# Patient Record
Sex: Male | Born: 1940
Health system: Southern US, Community
[De-identification: ages and names within clinical notes are randomized; demographics above are authoritative.]

## PROBLEM LIST (undated history)

## (undated) DIAGNOSIS — Z720 Tobacco use: Secondary | ICD-10-CM

## (undated) DIAGNOSIS — R011 Cardiac murmur, unspecified: Secondary | ICD-10-CM

## (undated) DIAGNOSIS — I739 Peripheral vascular disease, unspecified: Secondary | ICD-10-CM

## (undated) DIAGNOSIS — E785 Hyperlipidemia, unspecified: Secondary | ICD-10-CM

## (undated) DIAGNOSIS — I639 Cerebral infarction, unspecified: Secondary | ICD-10-CM

## (undated) DIAGNOSIS — I2510 Atherosclerotic heart disease of native coronary artery without angina pectoris: Secondary | ICD-10-CM

## (undated) DIAGNOSIS — H269 Unspecified cataract: Secondary | ICD-10-CM

## (undated) DIAGNOSIS — C61 Malignant neoplasm of prostate: Secondary | ICD-10-CM

## (undated) DIAGNOSIS — I10 Essential (primary) hypertension: Secondary | ICD-10-CM

## (undated) DIAGNOSIS — I493 Ventricular premature depolarization: Secondary | ICD-10-CM

## (undated) HISTORY — DX: Unspecified cataract: H26.9

## (undated) HISTORY — DX: Hyperlipidemia, unspecified: E78.5

## (undated) HISTORY — DX: Atherosclerotic heart disease of native coronary artery without angina pectoris: I25.10

## (undated) HISTORY — DX: Essential (primary) hypertension: I10

## (undated) HISTORY — DX: Malignant neoplasm of prostate: C61

## (undated) HISTORY — PX: EYE SURGERY: SHX253

---

## 2003-09-21 ENCOUNTER — Ambulatory Visit (HOSPITAL_COMMUNITY): Admission: RE | Admit: 2003-09-21 | Discharge: 2003-09-21 | Payer: Self-pay | Admitting: Hematology and Oncology

## 2004-03-05 ENCOUNTER — Ambulatory Visit: Payer: Self-pay | Admitting: *Deleted

## 2004-03-24 ENCOUNTER — Ambulatory Visit: Payer: Self-pay | Admitting: Family Medicine

## 2004-04-01 ENCOUNTER — Ambulatory Visit: Payer: Self-pay | Admitting: Family Medicine

## 2004-05-02 ENCOUNTER — Ambulatory Visit: Payer: Self-pay | Admitting: Family Medicine

## 2004-05-26 ENCOUNTER — Encounter (INDEPENDENT_AMBULATORY_CARE_PROVIDER_SITE_OTHER): Payer: Self-pay | Admitting: Family Medicine

## 2004-05-26 LAB — CONVERTED CEMR LAB: PSA: 2.7 ng/mL

## 2004-07-09 ENCOUNTER — Ambulatory Visit: Payer: Self-pay | Admitting: Family Medicine

## 2004-11-13 ENCOUNTER — Ambulatory Visit: Payer: Self-pay | Admitting: Family Medicine

## 2004-12-30 ENCOUNTER — Ambulatory Visit: Payer: Self-pay | Admitting: Family Medicine

## 2005-03-02 ENCOUNTER — Ambulatory Visit: Payer: Self-pay | Admitting: Family Medicine

## 2005-03-10 ENCOUNTER — Ambulatory Visit: Payer: Self-pay | Admitting: Family Medicine

## 2005-07-03 ENCOUNTER — Ambulatory Visit: Payer: Self-pay | Admitting: Family Medicine

## 2005-08-13 ENCOUNTER — Ambulatory Visit: Payer: Self-pay | Admitting: Family Medicine

## 2005-09-29 ENCOUNTER — Ambulatory Visit: Payer: Self-pay | Admitting: Family Medicine

## 2005-12-03 ENCOUNTER — Ambulatory Visit: Payer: Self-pay | Admitting: Family Medicine

## 2006-01-21 ENCOUNTER — Ambulatory Visit: Payer: Self-pay | Admitting: Family Medicine

## 2006-02-08 ENCOUNTER — Ambulatory Visit: Payer: Self-pay | Admitting: Family Medicine

## 2006-02-09 ENCOUNTER — Ambulatory Visit: Payer: Self-pay | Admitting: Family Medicine

## 2006-04-28 ENCOUNTER — Ambulatory Visit: Payer: Self-pay | Admitting: Nurse Practitioner

## 2006-06-22 ENCOUNTER — Ambulatory Visit: Payer: Self-pay | Admitting: Family Medicine

## 2006-10-26 ENCOUNTER — Ambulatory Visit: Payer: Self-pay | Admitting: Family Medicine

## 2006-11-03 ENCOUNTER — Encounter (INDEPENDENT_AMBULATORY_CARE_PROVIDER_SITE_OTHER): Payer: Self-pay | Admitting: Family Medicine

## 2006-11-03 DIAGNOSIS — F17200 Nicotine dependence, unspecified, uncomplicated: Secondary | ICD-10-CM

## 2006-11-03 DIAGNOSIS — I2510 Atherosclerotic heart disease of native coronary artery without angina pectoris: Secondary | ICD-10-CM

## 2006-11-03 DIAGNOSIS — I160 Hypertensive urgency: Secondary | ICD-10-CM

## 2006-11-25 ENCOUNTER — Ambulatory Visit: Payer: Self-pay | Admitting: Family Medicine

## 2006-12-30 ENCOUNTER — Ambulatory Visit: Payer: Self-pay | Admitting: Family Medicine

## 2007-01-07 ENCOUNTER — Encounter (INDEPENDENT_AMBULATORY_CARE_PROVIDER_SITE_OTHER): Payer: Self-pay | Admitting: Family Medicine

## 2007-01-07 ENCOUNTER — Ambulatory Visit: Payer: Self-pay | Admitting: *Deleted

## 2007-01-07 ENCOUNTER — Ambulatory Visit (HOSPITAL_COMMUNITY): Admission: RE | Admit: 2007-01-07 | Discharge: 2007-01-07 | Payer: Self-pay | Admitting: Family Medicine

## 2007-02-09 ENCOUNTER — Encounter (INDEPENDENT_AMBULATORY_CARE_PROVIDER_SITE_OTHER): Payer: Self-pay | Admitting: *Deleted

## 2007-04-06 ENCOUNTER — Encounter (INDEPENDENT_AMBULATORY_CARE_PROVIDER_SITE_OTHER): Payer: Self-pay | Admitting: Family Medicine

## 2007-04-06 ENCOUNTER — Ambulatory Visit: Payer: Self-pay | Admitting: Family Medicine

## 2007-04-06 LAB — CONVERTED CEMR LAB: Helicobacter Pylori Antibody-IgG: <0.4

## 2007-06-03 ENCOUNTER — Emergency Department (HOSPITAL_COMMUNITY): Admission: EM | Admit: 2007-06-03 | Discharge: 2007-06-03 | Payer: Self-pay | Admitting: Family Medicine

## 2007-06-23 ENCOUNTER — Ambulatory Visit: Payer: Self-pay | Admitting: Internal Medicine

## 2007-08-25 ENCOUNTER — Ambulatory Visit: Payer: Self-pay | Admitting: Family Medicine

## 2007-09-01 ENCOUNTER — Ambulatory Visit: Payer: Self-pay | Admitting: Oncology

## 2007-09-28 ENCOUNTER — Ambulatory Visit: Admission: RE | Admit: 2007-09-28 | Discharge: 2007-12-06 | Payer: Self-pay | Admitting: Radiation Oncology

## 2007-11-15 ENCOUNTER — Ambulatory Visit: Payer: Self-pay | Admitting: Family Medicine

## 2007-11-24 ENCOUNTER — Ambulatory Visit: Payer: Self-pay | Admitting: Family Medicine

## 2007-11-24 LAB — CONVERTED CEMR LAB
ALT: 14 units/L (ref 0–53)
Albumin: 4.7 g/dL (ref 3.5–5.2)
Calcium: 9.9 mg/dL (ref 8.4–10.5)
Cholesterol: 131 mg/dL (ref 0–200)
Creatinine, Ser: 0.98 mg/dL (ref 0.40–1.50)
LDL Cholesterol: 62 mg/dL (ref 0–99)
Sodium: 138 meq/L (ref 135–145)
Total CHOL/HDL Ratio: 2.7
Total Protein: 7.1 g/dL (ref 6.0–8.3)
Triglycerides: 103 mg/dL (ref <150)

## 2007-12-27 ENCOUNTER — Encounter (INDEPENDENT_AMBULATORY_CARE_PROVIDER_SITE_OTHER): Payer: Self-pay | Admitting: Family Medicine

## 2007-12-27 ENCOUNTER — Ambulatory Visit: Payer: Self-pay | Admitting: Internal Medicine

## 2007-12-27 LAB — CONVERTED CEMR LAB: Microalb, Ur: 0.47 mg/dL (ref 0.00–1.89)

## 2008-03-06 ENCOUNTER — Ambulatory Visit: Admission: RE | Admit: 2008-03-06 | Discharge: 2008-05-31 | Payer: Self-pay | Admitting: Radiation Oncology

## 2008-03-29 ENCOUNTER — Encounter: Admission: RE | Admit: 2008-03-29 | Discharge: 2008-03-29 | Payer: Self-pay | Admitting: Urology

## 2008-05-31 ENCOUNTER — Ambulatory Visit: Admission: RE | Admit: 2008-05-31 | Discharge: 2008-07-26 | Payer: Self-pay | Admitting: Radiation Oncology

## 2008-07-25 ENCOUNTER — Ambulatory Visit: Payer: Self-pay | Admitting: Internal Medicine

## 2008-08-23 ENCOUNTER — Ambulatory Visit (HOSPITAL_COMMUNITY): Admission: EM | Admit: 2008-08-23 | Discharge: 2008-08-23 | Payer: Self-pay | Admitting: Emergency Medicine

## 2008-08-23 ENCOUNTER — Encounter (INDEPENDENT_AMBULATORY_CARE_PROVIDER_SITE_OTHER): Payer: Self-pay | Admitting: Plastic Surgery

## 2008-11-15 ENCOUNTER — Ambulatory Visit: Payer: Self-pay | Admitting: Family Medicine

## 2009-01-05 ENCOUNTER — Emergency Department (HOSPITAL_BASED_OUTPATIENT_CLINIC_OR_DEPARTMENT_OTHER): Admission: EM | Admit: 2009-01-05 | Discharge: 2009-01-05 | Payer: Self-pay | Admitting: Emergency Medicine

## 2009-02-19 ENCOUNTER — Ambulatory Visit: Payer: Self-pay | Admitting: Family Medicine

## 2009-06-07 ENCOUNTER — Ambulatory Visit: Payer: Self-pay | Admitting: Family Medicine

## 2009-06-07 LAB — CONVERTED CEMR LAB
ALT: 10 units/L (ref 0–53)
Albumin: 4.6 g/dL (ref 3.5–5.2)
BUN: 14 mg/dL (ref 6–23)
CO2: 24 meq/L (ref 19–32)
Calcium: 9.3 mg/dL (ref 8.4–10.5)
Cholesterol: 153 mg/dL (ref 0–200)
Potassium: 4.2 meq/L (ref 3.5–5.3)
Total Bilirubin: 0.4 mg/dL (ref 0.3–1.2)
Total Protein: 6.8 g/dL (ref 6.0–8.3)
Triglycerides: 97 mg/dL (ref <150)
VLDL: 19 mg/dL (ref 0–40)

## 2009-08-02 ENCOUNTER — Ambulatory Visit: Payer: Self-pay | Admitting: Family Medicine

## 2009-10-30 ENCOUNTER — Ambulatory Visit: Payer: Self-pay | Admitting: Family Medicine

## 2010-01-30 ENCOUNTER — Ambulatory Visit: Payer: Self-pay | Admitting: Family Medicine

## 2010-01-30 LAB — CONVERTED CEMR LAB
ALT: 9 units/L (ref 0–53)
Albumin: 4.7 g/dL (ref 3.5–5.2)
BUN: 11 mg/dL (ref 6–23)
Basophils Absolute: 0.1 10*3/uL (ref 0.0–0.1)
Calcium: 9.8 mg/dL (ref 8.4–10.5)
Eosinophils Absolute: 0.3 10*3/uL (ref 0.0–0.7)
Eosinophils Relative: 4 % (ref 0–5)
HDL: 43 mg/dL (ref >39)
Hemoglobin: 16.3 g/dL (ref 13.0–17.0)
Hgb A1c MFr Bld: 8.6 % — ABNORMAL HIGH (ref <5.7)
LDL Cholesterol: 86 mg/dL (ref 0–99)
Monocytes Absolute: 0.6 10*3/uL (ref 0.1–1.0)
Monocytes Relative: 7 % (ref 3–12)
Neutro Abs: 4.8 10*3/uL (ref 1.7–7.7)
Neutrophils Relative %: 61 % (ref 43–77)
Platelets: 233 10*3/uL (ref 150–400)
Potassium: 4.3 meq/L (ref 3.5–5.3)
RDW: 13.1 % (ref 11.5–15.5)
Total CHOL/HDL Ratio: 3.5
Total Protein: 6.7 g/dL (ref 6.0–8.3)
VLDL: 20 mg/dL (ref 0–40)
WBC: 8 10*3/uL (ref 4.0–10.5)

## 2010-05-17 ENCOUNTER — Emergency Department (HOSPITAL_COMMUNITY)
Admission: EM | Admit: 2010-05-17 | Discharge: 2010-05-17 | Payer: Self-pay | Source: Home / Self Care | Admitting: Internal Medicine

## 2010-05-21 ENCOUNTER — Ambulatory Visit: Admission: RE | Admit: 2010-05-21 | Payer: Self-pay | Source: Home / Self Care | Admitting: Orthopedic Surgery

## 2010-06-05 ENCOUNTER — Encounter (INDEPENDENT_AMBULATORY_CARE_PROVIDER_SITE_OTHER): Payer: Self-pay | Admitting: Family Medicine

## 2010-06-05 LAB — CONVERTED CEMR LAB: Microalb, Ur: 1.18 mg/dL (ref 0.00–1.89)

## 2010-06-18 ENCOUNTER — Emergency Department (HOSPITAL_BASED_OUTPATIENT_CLINIC_OR_DEPARTMENT_OTHER)
Admission: EM | Admit: 2010-06-18 | Discharge: 2010-06-18 | Payer: Self-pay | Source: Home / Self Care | Admitting: Emergency Medicine

## 2010-06-26 LAB — BASIC METABOLIC PANEL: Glucose: 174 mg/dL

## 2010-06-26 LAB — CBC AND DIFFERENTIAL: Platelets: 293 10*3/uL (ref 150–399)

## 2010-09-03 LAB — CBC
HCT: 40.4 % (ref 39.0–52.0)
Hemoglobin: 13.9 g/dL (ref 13.0–17.0)
MCHC: 34.5 g/dL (ref 30.0–36.0)
MCV: 94.8 fL (ref 78.0–100.0)
Platelets: 313 10*3/uL (ref 150–400)
RBC: 4.26 MIL/uL (ref 4.22–5.81)
WBC: 8.5 10*3/uL (ref 4.0–10.5)

## 2010-09-03 LAB — DIFFERENTIAL
Basophils Absolute: 0 10*3/uL (ref 0.0–0.1)
Eosinophils Absolute: 0.3 10*3/uL (ref 0.0–0.7)
Lymphs Abs: 1.8 10*3/uL (ref 0.7–4.0)
Neutro Abs: 5.8 10*3/uL (ref 1.7–7.7)
Neutrophils Relative %: 68 % (ref 43–77)

## 2010-09-03 LAB — BASIC METABOLIC PANEL: Glucose, Bld: 158 mg/dL — ABNORMAL HIGH (ref 70–99)

## 2010-09-03 LAB — GLUCOSE, CAPILLARY: Glucose-Capillary: 172 mg/dL — ABNORMAL HIGH (ref 70–99)

## 2010-10-07 NOTE — Consult Note (Signed)
NAMEMELVERN, Joshua Ray              ACCOUNT NO.:  [id number]   MEDICAL RECORD NO.:  [id number]          PATIENT TYPE:  INP   LOCATION:  0098                         FACILITY:  [facility] [place]   PHYSICIAN:  Loreta Ave, MD DATE OF BIRTH:  July 06, 1940   DATE OF CONSULTATION:  08/23/2008  DATE OF DISCHARGE:                                 CONSULTATION   PREOPERATIVE DIAGNOSIS:  Mangled right index finger.   POSTOPERATIVE DIAGNOSIS:  Mangled right index finger.   PROCEDURE:  Ray amputation, right index finger.   SURGEON:  Dr. Noelle Penner.   ASSISTANT:  None.   ANESTHESIA:  Bier block.   TOURNIQUET TIME:  63 minutes at 325 mmHg.   CLINICAL INDICATIONS:  Joshua Ray is a 70 year old right hand  dominant male who sustained a multilevel near amputation injury to his  right index finger at the mid and distal phalanges.  I felt that  shortening his middle phalanx enough to provide soft tissue coverage  would subject him to loss of insertion of the FDS tendon.  Therefore, a  ray amputation was offered.  After a discussion of the risks of surgery  which include but are not limited to bleeding, infection, chronic pain,  stiffness, scarring, and the need for future surgery, Joshua Ray understands  these risks and desires to proceed.   DESCRIPTION OF THE OPERATION:  The patient was brought to the operating  room and placed in the supine position on the operating room table with  the right upper extremity on an arm board.  Anesthesia provided Bier  block anesthesia with a double tourniquet.  The right upper extremity  was prepped with chlorhexidine and draped into a sterile field.  A  fishmouth incision was made just distal the metacarpal head over the  right index finger and the extensor tendon apparatus was transected with  a 15 blade.  Superficial veins were cauterized with bipolar  electrocautery.  Next, the MCP joint was disarticulated and longitudinal  traction was placed on the finger after  transecting the volar plate.  This pulled the proximal flexor tendons into the wound and they were  transected with a 15 blade under tension.  Next, the digital arteries  were localized and coagulated with bipolar electrocautery on the volar  surface.  The digital nerves were isolated, traced distally, coagulated  with bipolar electrocautery and transected.  They were then  buried into  the soft tissue on the radial side of the long finger for the ulnar  digital nerve and deep in the web space for the radial-sided digital  nerve.  They were sewed over but not sewed themselves with 5-0 Monocryl  sutures.  Next, the soft tissue of the volar aspect of the hand was sewn  to the periosteum of the index finger metacarpal.  Next, proximal to the  MCP joint, the periosteum was incised with a 15 blade and stripped  proximally.  A 45 degree angle bevel was then created transversely in  the metacarpal with the ulnar portion being at the level of the  metacarpal head and the proximal portion being towards the thumb.  The  index finger metacarpal was then transected obliquely with a sagittal  saw.  Next, the wound was irrigated copiously with normal saline and  closed with 4-0 interrupted horizontal mattress sutures.  Xeroform and  sterile dressings were applied as was a volar  resting splint.  Sponge and needle counts were reported as correct times  two.  Before applying the splint, I injected with 10 cc of 0.5% Marcaine  plain into the area of the wound for postoperative pain control.  The  tourniquet was then deflated and a pink color returned to all remaining  digits.      Loreta Ave, MD  Electronically Signed     CF/MEDQ  D:  08/23/2008  T:  08/23/2008  Job:  [id number]

## 2011-07-11 ENCOUNTER — Ambulatory Visit (INDEPENDENT_AMBULATORY_CARE_PROVIDER_SITE_OTHER): Payer: Medicare Other | Admitting: Emergency Medicine

## 2011-07-11 ENCOUNTER — Ambulatory Visit: Payer: Medicare Other

## 2011-07-11 VITALS — BP 158/73 | HR 96 | Temp 97.7°F | Resp 18 | Ht 71.0 in | Wt 193.8 lb

## 2011-07-11 DIAGNOSIS — E119 Type 2 diabetes mellitus without complications: Secondary | ICD-10-CM

## 2011-07-11 DIAGNOSIS — R05 Cough: Secondary | ICD-10-CM

## 2011-07-11 DIAGNOSIS — J329 Chronic sinusitis, unspecified: Secondary | ICD-10-CM

## 2011-07-11 LAB — POCT CBC
Hemoglobin: 15.6 g/dL (ref 14.1–18.1)
Lymph, poc: 1.7 (ref 0.6–3.4)
MID (cbc): 0.6 (ref 0–0.9)
POC Granulocyte: 4.2 (ref 2–6.9)
POC LYMPH PERCENT: 26.4 %L (ref 10–50)
POC MID %: 8.6 %M (ref 0–12)
RBC: 5.08 M/uL (ref 4.69–6.13)

## 2011-07-11 LAB — GLUCOSE, POCT (MANUAL RESULT ENTRY): POC Glucose: 158

## 2011-07-11 MED ORDER — PREDNISONE 10 MG PO TABS: ORAL_TABLET | ORAL | Status: DC

## 2011-07-11 MED ORDER — AZITHROMYCIN 250 MG PO TABS: ORAL_TABLET | ORAL | Status: AC

## 2011-07-11 NOTE — Patient Instructions (Signed)
Cough, Adult  A cough is a reflex that helps clear your throat and airways. It can help heal the body or may be a reaction to an irritated airway. A cough may only last 2 or 3 weeks (acute) or may last more than 8 weeks (chronic).  CAUSES Acute cough:  Viral or bacterial infections.  Chronic cough:  Infections.   Allergies.   Asthma.   Post-nasal drip.   Smoking.   Heartburn or acid reflux.   Some medicines.   Chronic lung problems (COPD).   Cancer.  SYMPTOMS   Cough.   Fever.   Chest pain.   Increased breathing rate.   High-pitched whistling sound when breathing (wheezing).   Colored mucus that you cough up (sputum).  TREATMENT   A bacterial cough may be treated with antibiotic medicine.   A viral cough must run its course and will not respond to antibiotics.   Your caregiver may recommend other treatments if you have a chronic cough.  HOME CARE INSTRUCTIONS   Only take over-the-counter or prescription medicines for pain, discomfort, or fever as directed by your caregiver. Use cough suppressants only as directed by your caregiver.   Use a cold steam vaporizer or humidifier in your bedroom or home to help loosen secretions.   Sleep in a semi-upright position if your cough is worse at night.   Rest as needed.   Stop smoking if you smoke.  SEEK IMMEDIATE MEDICAL CARE IF:   You have pus in your sputum.   Your cough starts to worsen.   You cannot control your cough with suppressants and are losing sleep.   You begin coughing up blood.   You have difficulty breathing.   You develop pain which is getting worse or is uncontrolled with medicine.   You have a fever.  MAKE SURE YOU:   Understand these instructions.   Will watch your condition.   Will get help right away if you are not doing well or get worse.  Document Released: 11/07/2010 Document Revised: 01/21/2011 Document Reviewed: 11/07/2010 Exit[employer] Patient Information 2012 [employer].  Smoking Cessation This document explains the best ways for you to quit smoking and new treatments to help. It lists new medicines that can double or triple your chances of quitting and quitting for good. It also considers ways to avoid relapses and concerns you may have about quitting, including weight gain. NICOTINE: A POWERFUL ADDICTION If you have tried to quit smoking, you know how hard it can be. It is hard because nicotine is a very addictive drug. For some people, it can be as addictive as heroin or cocaine. Usually, people make 2 or 3 tries, or more, before finally being able to quit. Each time you try to quit, you can learn about what helps and what hurts. Quitting takes hard work and a lot of effort, but you can quit smoking. QUITTING SMOKING IS ONE OF THE MOST IMPORTANT THINGS YOU WILL EVER DO.  You will live longer, feel better, and live better.   The impact on your body of quitting smoking is felt almost immediately:   Within 20 minutes, blood pressure decreases. Pulse returns to its normal level.   After 8 hours, carbon monoxide levels in the blood return to normal. Oxygen level increases.   After 24 hours, chance of heart attack starts to decrease. Breath, hair, and body stop smelling like smoke.   After 48 hours, damaged nerve endings begin to recover. Sense of taste and   smell improve.   After 72 hours, the body is virtually free of nicotine. Bronchial tubes relax and breathing becomes easier.   After 2 to 12 weeks, lungs can hold more air. Exercise becomes easier and circulation improves.   Quitting will reduce your risk of having a heart attack, stroke, cancer, or lung disease:   After 1 year, the risk of coronary heart disease is cut in half.   After 5 years, the risk of stroke falls to the same as a nonsmoker.   After 10 years, the risk of lung cancer is cut in half and the risk of other cancers decreases significantly.   After 15 years, the risk of coronary  heart disease drops, usually to the level of a nonsmoker.   If you are pregnant, quitting smoking will improve your chances of having a healthy baby.   The people you live with, especially your children, will be healthier.   You will have extra money to spend on things other than cigarettes.  FIVE KEYS TO QUITTING Studies have shown that these 5 steps will help you quit smoking and quit for good. You have the best chances of quitting if you use them together: 1. Get ready.  2. Get support and encouragement.  3. Learn new skills and behaviors.  4. Get medicine to reduce your nicotine addiction and use it correctly.  5. Be prepared for relapse or difficult situations. Be determined to continue trying to quit, even if you do not succeed at first.  1. GET READY  Set a quit date.   Change your environment.   Get rid of ALL cigarettes, ashtrays, matches, and lighters in your home, car, and place of work.   Do not let people smoke in your home.   Review your past attempts to quit. Think about what worked and what did not.   Once you quit, do not smoke. NOT EVEN A PUFF!  2. GET SUPPORT AND ENCOURAGEMENT Studies have shown that you have a better chance of being successful if you have help. You can get support in many ways.  Tell your family, friends, and coworkers that you are going to quit and need their support. Ask them not to smoke around you.   Talk to your caregivers (doctor, dentist, nurse, pharmacist, psychologist, and/or smoking counselor).   Get individual, group, or telephone counseling and support. The more counseling you have, the better your chances are of quitting. Programs are available at local hospitals and health centers. Call your local health department for information about programs in your area.   Spiritual beliefs and practices may help some smokers quit.   Quit meters are small computer programs online or downloadable that keep track of quit statistics, such as  amount of "quit-time," cigarettes not smoked, and money saved.   Many smokers find one or more of the many self-help books available useful in helping them quit and stay off tobacco.  3. LEARN NEW SKILLS AND BEHAVIORS  Try to distract yourself from urges to smoke. Talk to someone, go for a walk, or occupy your time with a task.   When you first try to quit, change your routine. Take a different route to work. Drink tea instead of coffee. Eat breakfast in a different place.   Do something to reduce your stress. Take a hot bath, exercise, or read a book.   Plan something enjoyable to do every day. Reward yourself for not smoking.   Explore interactive web-based programs that specialize   in helping you quit.  4. GET MEDICINE AND USE IT CORRECTLY Medicines can help you stop smoking and decrease the urge to smoke. Combining medicine with the above behavioral methods and support can quadruple your chances of successfully quitting smoking. The U.S. Food and Drug Administration (FDA) has approved 7 medicines to help you quit smoking. These medicines fall into 3 categories.  Nicotine replacement therapy (delivers nicotine to your body without the negative effects and risks of smoking):   Nicotine gum: Available over-the-counter.   Nicotine lozenges: Available over-the-counter.   Nicotine inhaler: Available by prescription.   Nicotine nasal spray: Available by prescription.   Nicotine skin patches (transdermal): Available by prescription and over-the-counter.   Antidepressant medicine (helps people abstain from smoking, but how this works is unknown):   Bupropion sustained-release (SR) tablets: Available by prescription.   Nicotinic receptor partial agonist (simulates the effect of nicotine in your brain):   Varenicline tartrate tablets: Available by prescription.   Ask your caregiver for advice about which medicines to use and how to use them. Carefully read the information on the  package.   Everyone who is trying to quit may benefit from using a medicine. If you are pregnant or trying to become pregnant, nursing an infant, you are under age 18, or you smoke fewer than 10 cigarettes per day, talk to your caregiver before taking any nicotine replacement medicines.   You should stop using a nicotine replacement product and call your caregiver if you experience nausea, dizziness, weakness, vomiting, fast or irregular heartbeat, mouth problems with the lozenge or gum, or redness or swelling of the skin around the patch that does not go away.   Do not use any other product containing nicotine while using a nicotine replacement product.   Talk to your caregiver before using these products if you have diabetes, heart disease, asthma, stomach ulcers, you had a recent heart attack, you have high blood pressure that is not controlled with medicine, a history of irregular heartbeat, or you have been prescribed medicine to help you quit smoking.  5. BE PREPARED FOR RELAPSE OR DIFFICULT SITUATIONS  Most relapses occur within the first 3 months after quitting. Do not be discouraged if you start smoking again. Remember, most people try several times before they finally quit.   You may have symptoms of withdrawal because your body is used to nicotine. You may crave cigarettes, be irritable, feel very hungry, cough often, get headaches, or have difficulty concentrating.   The withdrawal symptoms are only temporary. They are strongest when you first quit, but they will go away within 10 to 14 days.  Here are some difficult situations to watch for:  Alcohol. Avoid drinking alcohol. Drinking lowers your chances of successfully quitting.   Caffeine. Try to reduce the amount of caffeine you consume. It also lowers your chances of successfully quitting.   Other smokers. Being around smoking can make you want to smoke. Avoid smokers.   Weight gain. Many smokers will gain weight when they  quit, usually less than 10 pounds. Eat a healthy diet and stay active. Do not let weight gain distract you from your main goal, quitting smoking. Some medicines that help you quit smoking may also help delay weight gain. You can always lose the weight gained after you quit.   Bad mood or depression. There are a lot of ways to improve your mood other than smoking.  If you are having problems with any of these situations, talk to your   caregiver. SPECIAL SITUATIONS AND CONDITIONS Studies suggest that everyone can quit smoking. Your situation or condition can give you a special reason to quit.  Pregnant women/new mothers: By quitting, you protect your baby's health and your own.   Hospitalized patients: By quitting, you reduce health problems and help healing.   Heart attack patients: By quitting, you reduce your risk of a second heart attack.   Lung, head, and neck cancer patients: By quitting, you reduce your chance of a second cancer.   Parents of children and adolescents: By quitting, you protect your children from illnesses caused by secondhand smoke.  QUESTIONS TO THINK ABOUT Think about the following questions before you try to stop smoking. You may want to talk about your answers with your caregiver.  Why do you want to quit?   If you tried to quit in the past, what helped and what did not?   What will be the most difficult situations for you after you quit? How will you plan to handle them?   Who can help you through the tough times? Your family? Friends? Caregiver?   What pleasures do you get from smoking? What ways can you still get pleasure if you quit?  Here are some questions to ask your caregiver:  How can you help me to be successful at quitting?   What medicine do you think would be best for me and how should I take it?   What should I do if I need more help?   What is smoking withdrawal like? How can I get information on withdrawal?  Quitting takes hard work and a  lot of effort, but you can quit smoking. FOR MORE INFORMATION  Smokefree.gov (http://www.smokefree.gov) provides free, accurate, evidence-based information and professional assistance to help support the immediate and long-term needs of people trying to quit smoking. Document Released: 05/05/2001 Document Revised: 01/21/2011 Document Reviewed: 02/25/2009 ExitCare Patient Information 2012 [employer]. 

## 2011-07-11 NOTE — Progress Notes (Signed)
  Subjective:    Patient ID: Joshua Ray, male    DOB: 20-Jan-1941, 71 y.o.   MRN: [id number]  HPI    Review of Systems     Objective:   Physical Exam        Assessment & Plan:

## 2011-07-11 NOTE — Progress Notes (Addendum)
  Subjective:    Patient ID: Joshua Ray, male    DOB: 1941/04/16, 71 y.o.   MRN: [id number]  HPI patient enters with onset Tuesday of head congestion. He also has developed a cough which is essentially nonproductive. He has a lot of facial pressure but denies any purulent nasal drainage. He has not coughed up any phlegm today. He continues to smoke a half pack a day. He is a diabetic but did not take his medication this morning. He has no interest in stopping smoking.    Review of Systems review of systems reveals the patient has coronary artery disease. He has had stents placed. He has not had any cardiac followup.     Objective:   Physical Exam HEENT exam reveals nasal congestion. The TMs are slightly injected. Throat is clear neck is supple the chest examination reveals wheezing in the right mid and lower lung fields. There is good air exchange. No rales were heard. Heart exam reveals a regular rate without murmurs rubs or gallop  [facility] reading (PRIMARY) by  Dr. Cleta Alberts chronic changes noted in both lower lung fields. No acute consolidated areas..       Assessment & Plan:  Initial assessment is upper respiratory infection with secondary sinusitis bronchitis. He has responded to antibiotics and steroids in the past. He did check a random sugar prior to give him prednisone.

## 2011-07-17 ENCOUNTER — Ambulatory Visit: Payer: MEDICARE | Admitting: Family Medicine

## 2011-07-19 ENCOUNTER — Ambulatory Visit (INDEPENDENT_AMBULATORY_CARE_PROVIDER_SITE_OTHER): Payer: Medicare Other | Admitting: Physician Assistant

## 2011-07-19 VITALS — BP 161/74 | HR 76 | Temp 97.5°F | Resp 16 | Ht 71.0 in | Wt 190.0 lb

## 2011-07-19 DIAGNOSIS — H6980 Other specified disorders of Eustachian tube, unspecified ear: Secondary | ICD-10-CM

## 2011-07-19 DIAGNOSIS — H6590 Unspecified nonsuppurative otitis media, unspecified ear: Secondary | ICD-10-CM

## 2011-07-19 DIAGNOSIS — H6690 Otitis media, unspecified, unspecified ear: Secondary | ICD-10-CM

## 2011-07-19 MED ORDER — CEFDINIR 300 MG PO CAPS: 600.0000 mg | ORAL_CAPSULE | Freq: Every day | ORAL | Status: AC

## 2011-07-19 MED ORDER — IPRATROPIUM BROMIDE 0.03 % NA SOLN: 2.0000 | Freq: Two times a day (BID) | NASAL | Status: DC

## 2011-07-19 NOTE — Patient Instructions (Addendum)
THE 3 SIMPLE RULES FOR NASAL SPRAY USE:  1. Don't snort. 2. Look at your toes and spray up your nose. 3. Use the opposite hand to spray in both nostrils.   Be sure to drink lots of water, at least 64 ounces daily, and get plenty of rest.  You can add Zyrtec (cetirizine) daily.  Complete the prednisone.  To reduce the risk of a nose bleed with the nasal spray, follow the instructions above and use a small amount of Vaseline on the tip of your finger to apply it to the inside of each nostril.

## 2011-07-19 NOTE — Progress Notes (Signed)
  Subjective:    Patient ID: Joshua Ray, male    DOB: 1941/03/25, 71 y.o.   MRN: [id number]  HPI This patient presents with bilateral ear fullness, worse on the right than the left. This occurred suddenly 5 days ago. He was seen one week ago with sinobronchitis. Has completed the Z-Pak and is tolerating the prednisone taper without difficulty.  He continues to smoke. Known coronary artery disease status post 4 stents. He also has diabetes. He is scheduled to followup with Dr. Audria Nine within the next 2 weeks to reestablish with her for primary care.  No fever or chills. No sore throat. Cough has not worsened. He has no intention of smoking cessation.   Review of Systems As above.    Objective:   Physical Exam Vital signs are noted. This is a well-developed, well-nourished white male who is awake, alert and oriented in no acute distress. Pupils are equal, round and reactive to light. Extraocular movements are intact. Sclera and conjunctiva are clear. Lateral external auditory canals are clear. There is serous fluid in the middle ear bilaterally. On the left there is purulence and injection in the attic. Nasal mucosa is erythematous with purulent drainage area oropharynx is clear. Neck is supple with mild tenderness on the right along the anterior cervical chain. No discrete lymphadenopathy is appreciated. No supraclavicular nodes. Heart has a regular rate and rhythm and lungs are clear bilaterally. Radial pulses are symmetrically strong. Skin is warm and dry.       Assessment & Plan:  Eustachian tube dysfunction. Bilateral serous otitis. Early left otitis media.  Cefdinir 300 mg, 2 daily x10 days. Atrovent nasal spray, 2 sprays in each nostril twice a day.  Anticipatory guidance provided.

## 2011-07-23 ENCOUNTER — Encounter: Payer: Self-pay | Admitting: *Deleted

## 2011-07-23 DIAGNOSIS — E785 Hyperlipidemia, unspecified: Secondary | ICD-10-CM

## 2011-07-23 DIAGNOSIS — I2510 Atherosclerotic heart disease of native coronary artery without angina pectoris: Secondary | ICD-10-CM

## 2011-07-23 DIAGNOSIS — I10 Essential (primary) hypertension: Secondary | ICD-10-CM

## 2011-07-24 ENCOUNTER — Ambulatory Visit (INDEPENDENT_AMBULATORY_CARE_PROVIDER_SITE_OTHER): Payer: Medicare Other | Admitting: Family Medicine

## 2011-07-24 VITALS — BP 145/79 | HR 96 | Temp 97.2°F | Resp 16 | Ht 71.0 in | Wt 190.0 lb

## 2011-07-24 DIAGNOSIS — F17200 Nicotine dependence, unspecified, uncomplicated: Secondary | ICD-10-CM

## 2011-07-24 DIAGNOSIS — I10 Essential (primary) hypertension: Secondary | ICD-10-CM

## 2011-07-24 DIAGNOSIS — I2510 Atherosclerotic heart disease of native coronary artery without angina pectoris: Secondary | ICD-10-CM

## 2011-07-24 DIAGNOSIS — E663 Overweight: Secondary | ICD-10-CM

## 2011-07-24 LAB — POCT GLYCOSYLATED HEMOGLOBIN (HGB A1C): Hemoglobin A1C: 7.9

## 2011-07-24 MED ORDER — METOPROLOL SUCCINATE ER 50 MG PO TB24: 50.0000 mg | ORAL_TABLET | Freq: Every day | ORAL | Status: DC

## 2011-07-24 MED ORDER — GLYBURIDE 5 MG PO TABS: 5.0000 mg | ORAL_TABLET | Freq: Two times a day (BID) | ORAL | Status: DC

## 2011-07-24 MED ORDER — SIMVASTATIN 20 MG PO TABS: 20.0000 mg | ORAL_TABLET | Freq: Every evening | ORAL | Status: DC

## 2011-07-24 MED ORDER — METFORMIN HCL 1000 MG PO TABS: 1000.0000 mg | ORAL_TABLET | Freq: Two times a day (BID) | ORAL | Status: DC

## 2011-07-24 NOTE — Patient Instructions (Signed)
Coronary Artery Disease, Risk Factors Research has shown that the risk of developing coronary artery disease (CAD) and having a heart attack increases with each factor you have. RISK FACTORS YOU CANNOT CHANGE Your age. Your risk goes up as you get older. Most heart attacks happen to people over the age of 50.  Gender. Men have a greater risk of heart attack than women, and they have attacks earlier in life. However, women are more likely to die from a heart attack.  Heredity. Children of parents with heart disease are more likely to develop it themselves.  Race. African Americans and other ethnic groups have a higher risk, possibly because of high blood pressure, a tendency toward obesity, and diabetes.  Your family. Most people with a strong family history of heart disease have one or more other risk factors.  RISK FACTORS YOU CAN CHANGE Exposure to tobacco smoke. Even secondhand smoke greatly increases the risk for heart disease.  High blood cholesterol may be lowered with changes in diet, activity, and medicines.  High blood pressure makes the heart work harder. This causes the heart muscles to become thick and, eventually, weaker. It also increases your risk of stroke, heart attack, and kidney or heart failure.  Physical inactivity is a risk factor for CAD. Regular physical activity helps prevent heart and blood vessel disease. Exercise helps control blood cholesterol, diabetes, obesity, and it may help lower blood pressure in some people.  Excess body fat, especially belly fat, increases the risk of heart disease and stroke even if there are no other risk factors. Excess weight increases the heart's workload and raises blood pressure and blood cholesterol.  Diabetes seriously increases your risk of developing CAD. If you have diabetes, you should work with your caregiver to manage it and control other risk factors.  OTHER RISK FACTORS FOR CAD How you respond to stress.  Drinking too much  alcohol may raise blood pressure, cause heart failure, and lead to stroke.  Total cholesterol greater than 200 milligrams.  HDL (good) cholesterol less than 40 milligrams. HDL helps keep cholesterol from building up in the walls of the arteries.  PREVENTING CAD Maintain a healthy weight.  Exercise or do physical activity.  Eat a heart-healthy diet low in fat and salt and high in fiber.  Control your blood pressure to keep it below 120 over 80.  Keep your cholesterol at a level that lowers your risk.  Manage diabetes if you have it.  Stop smoking.  Learn how to manage stress.  HEART SMART SUBSTITUTIONS Instead of whole or 2% milk and cream, use skim milk.  Instead of fried foods, eat baked, steamed, boiled, broiled, or microwaved foods.  Instead of lard, butter, palm and coconut oils, cook with unsaturated vegetable oils, such as corn, olive, canola, safflower, sesame, soybean, sunflower, or peanut.  Instead of fatty cuts of meat, eat lean cuts of meat or cut off the fatty parts.  Instead of 1 whole egg in recipes, use 2 egg whites.  Instead of sauces, butter, and salt, season vegetables with herbs and spices.  Instead of regular hard and processed cheeses, eat low-fat, low-sodium cheeses.  Instead of salted potato chips, choose low-fat, unsalted tortilla and potato chips and unsalted pretzels and popcorn.  Instead of sour cream and mayonnaise, use plain low-fat yogurt, low-fat cottage cheese, or low-fat or "light" sour cream.  FOR MORE INFORMATION  National Heart Lung and Blood Institute: https://nielsen.com/ American Heart Association: PopSteam.is Document Released: 08/01/2003 Document Revised: 01/21/2011  Document Reviewed: 07/27/2007 [facility] Patient Information 2012 [place], [place]    .Smoking Cessation This document explains the best ways for you to quit smoking and new treatments to help. It lists new medicines that can double or triple your chances of  quitting and quitting for good. It also considers ways to avoid relapses and concerns you may have about quitting, including weight gain. NICOTINE: A POWERFUL ADDICTION If you have tried to quit smoking, you know how hard it can be. It is hard because nicotine is a very addictive drug. For some people, it can be as addictive as heroin or cocaine. Usually, people make 2 or 3 tries, or more, before finally being able to quit. Each time you try to quit, you can learn about what helps and what hurts. Quitting takes hard work and a lot of effort, but you can quit smoking. QUITTING SMOKING IS ONE OF THE MOST IMPORTANT THINGS YOU WILL EVER DO.  You will live longer, feel better, and live better.   The impact on your body of quitting smoking is felt almost immediately:   Within 20 minutes, blood pressure decreases. Pulse returns to its normal level.   After 8 hours, carbon monoxide levels in the blood return to normal. Oxygen level increases.   After 24 hours, chance of heart attack starts to decrease. Breath, hair, and body stop smelling like smoke.   After 48 hours, damaged nerve endings begin to recover. Sense of taste and smell improve.   After 72 hours, the body is virtually free of nicotine. Bronchial tubes relax and breathing becomes easier.   After 2 to 12 weeks, lungs can hold more air. Exercise becomes easier and circulation improves.   Quitting will reduce your risk of having a heart attack, stroke, cancer, or lung disease:   After 1 year, the risk of coronary heart disease is cut in half.   After 5 years, the risk of stroke falls to the same as a nonsmoker.   After 10 years, the risk of lung cancer is cut in half and the risk of other cancers decreases significantly.   After 15 years, the risk of coronary heart disease drops, usually to the level of a nonsmoker.   If you are pregnant, quitting smoking will improve your chances of having a healthy baby.   The people you live with,  especially your children, will be healthier.   You will have extra money to spend on things other than cigarettes.  FIVE KEYS TO QUITTING Studies have shown that these 5 steps will help you quit smoking and quit for good. You have the best chances of quitting if you use them together: 1. Get ready.  2. Get support and encouragement.  3. Learn new skills and behaviors.  4. Get medicine to reduce your nicotine addiction and use it correctly.  5. Be prepared for relapse or difficult situations. Be determined to continue trying to quit, even if you do not succeed at first.  1. GET READY  Set a quit date.   Change your environment.   Get rid of ALL cigarettes, ashtrays, matches, and lighters in your home, car, and place of work.   Do not let people smoke in your home.   Review your past attempts to quit. Think about what worked and what did not.   Once you quit, do not smoke. NOT EVEN A PUFF!  2. GET SUPPORT AND ENCOURAGEMENT Studies have shown that you have a better chance of being successful  if you have help. You can get support in many ways.  Tell your family, friends, and coworkers that you are going to quit and need their support. Ask them not to smoke around you.   Talk to your caregivers (doctor, dentist, nurse, pharmacist, psychologist, and/or smoking counselor).   Get individual, group, or telephone counseling and support. The more counseling you have, the better your chances are of quitting. Programs are available at [employer] and health centers. Call your local health department for information about programs in your area.   Spiritual beliefs and practices may help some smokers quit.   Quit meters are Photographer that keep track of quit statistics, such as amount of "quit-time," cigarettes not smoked, and money saved.   Many smokers find one or more of the many self-help books available useful in helping them quit and stay off  tobacco.  3. LEARN NEW SKILLS AND BEHAVIORS  Try to distract yourself from urges to smoke. Talk to someone, go for a walk, or occupy your time with a task.   When you first try to quit, change your routine. Take a different route to work. Drink tea instead of coffee. Eat breakfast in a different place.   Do something to reduce your stress. Take a hot bath, exercise, or read a book.   Plan something enjoyable to do every day. Reward yourself for not smoking.   Explore interactive web-based programs that specialize in helping you quit.  4. GET MEDICINE AND USE IT CORRECTLY Medicines can help you stop smoking and decrease the urge to smoke. Combining medicine with the above behavioral methods and support can quadruple your chances of successfully quitting smoking. The [place]. Food and Drug Administration (FDA) has approved 7 medicines to help you quit smoking. These medicines fall into 3 categories.  Nicotine replacement therapy (delivers nicotine to your body without the negative effects and risks of smoking):   Nicotine gum: Available over-the-counter.   Nicotine lozenges: Available over-the-counter.   Nicotine inhaler: Available by prescription.   Nicotine nasal spray: Available by prescription.   Nicotine skin patches (transdermal): Available by prescription and over-the-counter.   Antidepressant medicine (helps people abstain from smoking, but how this works is unknown):   Bupropion sustained-release (SR) tablets: Available by prescription.   Nicotinic receptor partial agonist (simulates the effect of nicotine in your brain):   Varenicline tartrate tablets: Available by prescription.   Ask your caregiver for advice about which medicines to use and how to use them. Carefully read the information on the package.   Everyone who is trying to quit may benefit from using a medicine. If you are pregnant or trying to become pregnant, nursing an infant, you are under age 81, or you smoke  fewer than 10 cigarettes per day, talk to your caregiver before taking any nicotine replacement medicines.   You should stop using a nicotine replacement product and call your caregiver if you experience nausea, dizziness, weakness, vomiting, fast or irregular heartbeat, mouth problems with the lozenge or gum, or redness or swelling of the skin around the patch that does not go away.   Do not use any other product containing nicotine while using a nicotine replacement product.   Talk to your caregiver before using these products if you have diabetes, heart disease, asthma, stomach ulcers, you had a recent heart attack, you have high blood pressure that is not controlled with medicine, a history of irregular heartbeat, or you have been prescribed  medicine to help you quit smoking.  5. BE PREPARED FOR RELAPSE OR DIFFICULT SITUATIONS  Most relapses occur within the first 3 months after quitting. Do not be discouraged if you start smoking again. Remember, most people try several times before they finally quit.   You may have symptoms of withdrawal because your body is used to nicotine. You may crave cigarettes, be irritable, feel very hungry, cough often, get headaches, or have difficulty concentrating.   The withdrawal symptoms are only temporary. They are strongest when you first quit, but they will go away within 10 to 14 days.  Here are some difficult situations to watch for:  Alcohol. Avoid drinking alcohol. Drinking lowers your chances of successfully quitting.   Caffeine. Try to reduce the amount of caffeine you consume. It also lowers your chances of successfully quitting.   Other smokers. Being around smoking can make you want to smoke. Avoid smokers.   Weight gain. Many smokers will gain weight when they quit, usually less than 10 pounds. Eat a healthy diet and stay active. Do not let weight gain distract you from your main goal, quitting smoking. Some medicines that help you quit smoking  may also help delay weight gain. You can always lose the weight gained after you quit.   Bad mood or depression. There are a lot of ways to improve your mood other than smoking.  If you are having problems with any of these situations, talk to your caregiver. SPECIAL SITUATIONS AND CONDITIONS Studies suggest that everyone can quit smoking. Your situation or condition can give you a special reason to quit.  Pregnant women/new mothers: By quitting, you protect your baby's health and your own.   Hospitalized patients: By quitting, you reduce health problems and help healing.   Heart attack patients: By quitting, you reduce your risk of a second heart attack.   Lung, head, and neck cancer patients: By quitting, you reduce your chance of a second cancer.   Parents of children and adolescents: By quitting, you protect your children from illnesses caused by secondhand smoke.  QUESTIONS TO THINK ABOUT Think about the following questions before you try to stop smoking. You may want to talk about your answers with your caregiver.  Why do you want to quit?   If you tried to quit in the past, what helped and what did not?   What will be the most difficult situations for you after you quit? How will you plan to handle them?   Who can help you through the tough times? Your family? Friends? Caregiver?   What pleasures do you get from smoking? What ways can you still get pleasure if you quit?  Here are some questions to ask your caregiver:  How can you help me to be successful at quitting?   What medicine do you think would be best for me and how should I take it?   What should I do if I need more help?   What is smoking withdrawal like? How can I get information on withdrawal?  Quitting takes hard work and a lot of effort, but you can quit smoking. FOR MORE INFORMATION  Smokefree.gov (http://www.davis-sullivan.com/) provides free, accurate, evidence-based information and professional assistance to  help support the immediate and long-term needs of people trying to quit smoking. Document Released: 05/05/2001 Document Revised: 01/21/2011 Document Reviewed: 02/25/2009 [facility] Patient Information 2012 [place], [place].

## 2011-07-24 NOTE — Progress Notes (Signed)
  Subjective:    Patient ID: Joshua Ray, male    DOB: May 03, 1941, 71 y.o.   MRN: [id number]  HPI  This 71 y.o Cauc male has HTN/ CAD, Type II Diabetes Mellitus (fair control). Recently treated for  Sinusitis/bronchitis with anitibiotic and steroids which elevated FSBS up to ~200. Yesterday, fasting  BS was 105. Recent evaluation by Podiatrist- no neuropathic problems with feet. He is compliant with   all medications Pt continues to smoke.   Review of Systems Noncontributory    Objective:   Physical Exam  Nursing note and vitals reviewed. Constitutional: He is oriented to person, place, and time. He appears well-developed and well-nourished. No distress.  HENT:  Head: Normocephalic and atraumatic.  Eyes: Conjunctivae and EOM are normal. No scleral icterus.  Cardiovascular: Normal rate.   Pulmonary/Chest: Effort normal. No respiratory distress.  Musculoskeletal: Normal range of motion.  Neurological: He is alert and oriented to person, place, and time. No cranial nerve deficit.  Skin: Skin is warm and dry.    Results for orders placed in visit on 07/24/11  POCT GLYCOSYLATED HEMOGLOBIN (HGB A1C)      Component Value Range   Hemoglobin A1C 7.9           Assessment & Plan:   1. Type II or unspecified type diabetes mellitus without mention of complication, uncontrolled  POCT HgB A1C Continue current medications and address nutrition issues; reminded pt of A1c goal < 7.0%  2. HTN, goal below 130/80    3. CAD (coronary artery disease)    4. Overweight (BMI 25.0-29.9)  Encouraged weight reduction   5.  Tobacco user                                                     Advised of ongoing risk and known CAD with continued tobacco use

## 2011-07-28 ENCOUNTER — Encounter: Payer: Self-pay | Admitting: Family Medicine

## 2011-08-18 ENCOUNTER — Ambulatory Visit: Payer: Self-pay | Admitting: Internal Medicine

## 2011-08-18 ENCOUNTER — Ambulatory Visit (INDEPENDENT_AMBULATORY_CARE_PROVIDER_SITE_OTHER): Payer: Medicare Other | Admitting: Internal Medicine

## 2011-08-18 VITALS — BP 136/78 | HR 93 | Temp 97.9°F | Resp 18 | Ht 71.5 in | Wt 186.0 lb

## 2011-08-18 DIAGNOSIS — F17200 Nicotine dependence, unspecified, uncomplicated: Secondary | ICD-10-CM

## 2011-08-18 DIAGNOSIS — J301 Allergic rhinitis due to pollen: Secondary | ICD-10-CM

## 2011-08-18 DIAGNOSIS — H6690 Otitis media, unspecified, unspecified ear: Secondary | ICD-10-CM

## 2011-08-18 DIAGNOSIS — J019 Acute sinusitis, unspecified: Secondary | ICD-10-CM

## 2011-08-18 DIAGNOSIS — J209 Acute bronchitis, unspecified: Secondary | ICD-10-CM

## 2011-08-18 MED ORDER — METHYLPREDNISOLONE ACETATE 80 MG/ML IJ SUSP
80.0000 mg | Freq: Once | INTRAMUSCULAR | Status: AC
  Administered 2011-08-18: 120 mg via INTRAMUSCULAR

## 2011-08-18 MED ORDER — HYDROCODONE-ACETAMINOPHEN 7.5-500 MG/15ML PO SOLN: 5.0000 mL | Freq: Four times a day (QID) | ORAL | Status: AC | PRN

## 2011-08-18 MED ORDER — CEFTRIAXONE SODIUM 1 G IJ SOLR
1.0000 g | INTRAMUSCULAR | Status: DC
  Administered 2011-08-18: 1 g via INTRAMUSCULAR

## 2011-08-18 NOTE — Patient Instructions (Signed)
Smoking Cessation, Tips for Success YOU CAN QUIT SMOKING If you are ready to quit smoking, congratulations! You have chosen to help yourself be healthier. Cigarettes bring nicotine, tar, carbon monoxide, and other irritants into your body. Your lungs, heart, and blood vessels will be able to work better without these poisons. There are many different ways to quit smoking. Nicotine gum, nicotine patches, a nicotine inhaler, or nicotine nasal spray can help with physical craving. Hypnosis, support groups, and medicines help break the habit of smoking. Here are some tips to help you quit for good.  Throw away all cigarettes.   Clean and remove all ashtrays from your home, work, and car.   On a card, write down your reasons for quitting. Carry the card with you and read it when you get the urge to smoke.   Cleanse your body of nicotine. Drink enough water and fluids to keep your urine clear or pale yellow. Do this after quitting to flush the nicotine from your body.   Learn to predict your moods. Do not let a bad situation be your excuse to have a cigarette. Some situations in your life might tempt you into wanting a cigarette.   Never have "just one" cigarette. It leads to wanting another and another. Remind yourself of your decision to quit.   Change habits associated with smoking. If you smoked while driving or when feeling stressed, try other activities to replace smoking. Stand up when drinking your coffee. Brush your teeth after eating. Sit in a different chair when you read the paper. Avoid alcohol while trying to quit, and try to drink fewer caffeinated beverages. Alcohol and caffeine may urge you to smoke.   Avoid foods and drinks that can trigger a desire to smoke, such as sugary or spicy foods and alcohol.   Ask people who smoke not to smoke around you.   Have something planned to do right after eating or having a cup of coffee. Take a walk or exercise to perk you up. This will help to  keep you from overeating.   Try a relaxation exercise to calm you down and decrease your stress. Remember, you may be tense and nervous for the first 2 weeks after you quit, but this will pass.   Find new activities to keep your hands busy. Play with a pen, coin, or rubber band. Doodle or draw things on paper.   Brush your teeth right after eating. This will help cut down on the craving for the taste of tobacco after meals. You can try mouthwash, too.   Use oral substitutes, such as lemon drops, carrots, a cinnamon stick, or chewing gum, in place of cigarettes. Keep them handy so they are available when you have the urge to smoke.   When you have the urge to smoke, try deep breathing.   Designate your home as a nonsmoking area.   If you are a heavy smoker, ask your caregiver about a prescription for nicotine chewing gum. It can ease your withdrawal from nicotine.   Reward yourself. Set aside the cigarette money you save and buy yourself something nice.   Look for support from others. Join a support group or smoking cessation program. Ask someone at home or at work to help you with your plan to quit smoking.   Always ask yourself, "Do I need this cigarette or is this just a reflex?" Tell yourself, "Today, I choose not to smoke," or "I do not want to smoke." You are  reminding yourself of your decision to quit, even if you do smoke a cigarette.  HOW WILL I FEEL WHEN I QUIT SMOKING?  The benefits of not smoking start within days of quitting.   You may have symptoms of withdrawal because your body is used to nicotine (the addictive substance in cigarettes). You may crave cigarettes, be irritable, feel very hungry, cough often, get headaches, or have difficulty concentrating.   The withdrawal symptoms are only temporary. They are strongest when you first quit but will go away within 10 to 14 days.   When withdrawal symptoms occur, stay in control. Think about your reasons for quitting. Remind  yourself that these are signs that your body is healing and getting used to being without cigarettes.   Remember that withdrawal symptoms are easier to treat than the major diseases that smoking can cause.   Even after the withdrawal is over, expect periodic urges to smoke. However, these cravings are generally short-lived and will go away whether you smoke or not. Do not smoke!   If you relapse and smoke again, do not lose hope. Most smokers quit 3 times before they are successful.   If you relapse, do not give up! Plan ahead and think about what you will do the next time you get the urge to smoke.  LIFE AS A NONSMOKER: MAKE IT FOR A MONTH, MAKE IT FOR LIFE Day 1: Hang this page where you will see it every day. Day 2: Get rid of all ashtrays, matches, and lighters. Day 3: Drink water. Breathe deeply between sips. Day 4: Avoid places with smoke-filled air, such as bars, clubs, or the smoking section of restaurants. Day 5: Keep track of how much money you save by not smoking. Day 6: Avoid boredom. Keep a good book with you or go to the movies. Day 7: Reward yourself! One week without smoking! Day 8: Make a dental appointment to get your teeth cleaned. Day 9: Decide how you will turn down a cigarette before it is offered to you. Day 10: Review your reasons for quitting. Day 11: Distract yourself. Stay active to keep your mind off smoking and to relieve tension. Take a walk, exercise, read a book, do a crossword puzzle, or try a new hobby. Day 12: Exercise. Get off the bus before your stop or use stairs instead of escalators. Day 13: Call on friends for support and encouragement. Day 14: Reward yourself! Two weeks without smoking! Day 15: Practice deep breathing exercises. Day 16: Bet a friend that you can stay a nonsmoker. Day 17: Ask to sit in nonsmoking sections of restaurants. Day 18: Hang up "No Smoking" signs. Day 19: Think of yourself as a nonsmoker. Day 20: Each morning, tell  yourself you will not smoke. Day 21: Reward yourself! Three weeks without smoking! Day 22: Think of smoking in negative ways. Remember how it stains your teeth, gives you bad breath, and leaves you short of breath. Day 23: Eat a nutritious breakfast. Day 24:Do not relive your days as a smoker. Day 25: Hold a pencil in your hand when talking on the telephone. Day 26: Tell all your friends you do not smoke. Day 27: Think about how much better food tastes. Day 28: Remember, one cigarette is one too many. Day 29: Take up a hobby that will keep your hands busy. Day 30: Congratulations! One month without smoking! Give yourself a big reward. Your caregiver can direct you to community resources or hospitals for support, which  may include:  Group support.   Education.   Hypnosis.   Subliminal therapy.  Document Released: 02/07/2004 Document Revised: 04/30/2011 Document Reviewed: 02/25/2009 [facility] Patient Information 2012 [place], [place].Sinusitis Sinuses are air pockets within the bones of your face. The growth of bacteria within a sinus leads to infection. The infection prevents the sinuses from draining. This infection is called sinusitis. SYMPTOMS  There will be different areas of pain depending on which sinuses have become infected.  The maxillary sinuses often produce pain beneath the eyes.   Frontal sinusitis may cause pain in the middle of the forehead and above the eyes.  Other problems (symptoms) include:  Toothaches.   Colored, pus-like (purulent) drainage from the nose.   Swelling, warmth, and tenderness over the sinus areas may be signs of infection.  TREATMENT  Sinusitis is most often determined by an exam.X-rays may be taken. If x-rays have been taken, make sure you obtain your results or find out how you are to obtain them. Your caregiver may give you medications (antibiotics). These are medications that will help kill the bacteria causing the infection. You may also be  given a medication (decongestant) that helps to reduce sinus swelling.  HOME CARE INSTRUCTIONS   Only take over-the-counter or prescription medicines for pain, discomfort, or fever as directed by your caregiver.   Drink extra fluids. Fluids help thin the mucus so your sinuses can drain more easily.   Applying either moist heat or ice packs to the sinus areas may help relieve discomfort.   Use saline nasal sprays to help moisten your sinuses. The sprays can be found at your local drugstore.  SEEK IMMEDIATE MEDICAL CARE IF:  You have a fever.   You have increasing pain, severe headaches, or toothache.   You have nausea, vomiting, or drowsiness.   You develop unusual swelling around the face or trouble seeing.  MAKE SURE YOU:   Understand these instructions.   Will watch your condition.   Will get help right away if you are not doing well or get worse.  Document Released: 05/11/2005 Document Revised: 04/30/2011 Document Reviewed: 12/08/2006 [facility] Patient Information 2012 [place], [place].

## 2011-08-18 NOTE — Progress Notes (Signed)
  Subjective:    Patient ID: Joshua Ray, male    DOB: 1941/02/27, 71 y.o.   MRN: [id number]  HPI Has persistent head congestion and facial pressure. Has cough with sputum. Going on for months and wants to see allergist--Dr. Johney Maine number given Heavy long term smoker and reluctant to quit. No sob, chest pain    Review of Systems Dr. Audria Nine primary    Objective:   Physical Exam Sinus tenderness and discharge R TM red and tender Lungs rhonchi and wheezes and sputum Smells of smoke       Assessment & Plan:  Refer to allergy Quit smoking Rocephin 1g IM Depomedrol 120mg  IM Amoxil 1g bid x21d Lortab elixir 60z 1 rf

## 2011-10-06 ENCOUNTER — Encounter: Payer: Self-pay | Admitting: Family Medicine

## 2011-10-06 ENCOUNTER — Ambulatory Visit (INDEPENDENT_AMBULATORY_CARE_PROVIDER_SITE_OTHER): Payer: Medicare Other | Admitting: Family Medicine

## 2011-10-06 VITALS — BP 141/78 | HR 94 | Temp 98.8°F | Resp 18 | Ht 70.0 in | Wt 186.4 lb

## 2011-10-06 DIAGNOSIS — T7840XA Allergy, unspecified, initial encounter: Secondary | ICD-10-CM

## 2011-10-06 LAB — POCT CBC
Granulocyte percent: 61.6 %G (ref 37–80)
HCT, POC: 42.4 % — AB (ref 43.5–53.7)
MID (cbc): 0.7 (ref 0–0.9)
POC Granulocyte: 5.4 (ref 2–6.9)
POC LYMPH PERCENT: 30.4 %L (ref 10–50)
Platelet Count, POC: 300 10*3/uL (ref 142–424)
RBC: 4.52 M/uL — AB (ref 4.69–6.13)
RDW, POC: 14.4 %

## 2011-10-06 NOTE — Progress Notes (Signed)
  Subjective:    Patient ID: Joshua Ray, male    DOB: 08/04/1940, 71 y.o.   MRN: [id number]  HPI   Pt presents with hx of left tongue swelling 2 days ago; he immediately started taking Benadryl  and had resolution after 8 doses. He had a similar episode ~ 1 1/2 years ago. He thinks it may be   Simvastatin which he has taken for years. He has allergies and "sinus" problems but takes nothing  routinely.He denies any insect bite or allergy to any food. He has not taken SImvastatin since Saturday  night (3 days ago).   Review of Systems As per HPI;otherwise, unremakable    Objective:   Physical Exam  Nursing note and vitals reviewed. Constitutional: He is oriented to person, place, and time. He appears well-developed and well-nourished. No distress.  HENT:  Head: Normocephalic and atraumatic.  Right Ear: External ear normal.  Left Ear: External ear normal.  Nose: Nose normal.  Mouth/Throat: Oropharynx is clear and moist. No oropharyngeal exudate.       Tongue appears normal  Eyes: Conjunctivae and EOM are normal. No scleral icterus.  Neck: Neck supple.  Cardiovascular: Normal rate.   Pulmonary/Chest: Effort normal. No respiratory distress.  Lymphadenopathy:    He has no cervical adenopathy.  Neurological: He is alert and oriented to person, place, and time.  Skin: Skin is warm and dry. No erythema.    Results for orders placed in visit on 10/06/11  POCT CBC      Component Value Range   WBC 8.7  4.6 - 10.2 (K/uL)   Lymph, poc 2.6  0.6 - 3.4    POC LYMPH PERCENT 30.4  10 - 50 (%L)   MID (cbc) 0.7  0 - 0.9    POC MID % 8.0  0 - 12 (%M)   POC Granulocyte 5.4  2 - 6.9    Granulocyte percent 61.6  37 - 80 (%G)   RBC 4.52 (*) 4.69 - 6.13 (M/uL)   Hemoglobin 14.2  14.1 - 18.1 (g/dL)   HCT, POC 69.6 (*) 29.5 - 53.7 (%)   MCV 93.8  80 - 97 (fL)   MCH, POC 31.4 (*) 27 - 31.2 (pg)   MCHC 33.5  31.8 - 35.4 (g/dL)   RDW, POC 28.4     Platelet Count, POC 300  142 - 424 (K/uL)   MPV 7.1  0 - 99.8 (fL)         Assessment & Plan:   1. Allergic reaction  Pt advised to stay off Simvastatin for next 5 days then resume on Sunday; if symptoms occur again, contact office immediately. If symptoms occur off Simvastatin, then allergy to this med is unlikely    RTC as scheduled on 11/23/11 for CPE/DM follow-up.

## 2011-10-06 NOTE — Patient Instructions (Signed)
Allergic Reaction, Mild to Moderate Allergies may happen from anything your body is sensitive to. This may be food, medications, pollens, chemicals, and nearly anything around you in everyday life that produces allergens. An allergen is anything that causes an allergy producing substance. Allergens cause your body to release allergic antibodies. Through a chain of events, they cause a release of histamine into the blood stream. Histamines are meant to protect you, but they also cause your discomfort. This is why antihistamines are often used for allergies. Heredity is often a factor in causing allergic reactions. This means you may have some of the same allergies as your parents. Allergies happen in all age groups. You may have some idea of what caused your reaction. There are many allergens around [place]. It may be difficult to know what caused your reaction. If this is a first time event, it may never happen again. Allergies cannot be cured but can be controlled with medications. SYMPTOMS  You may get some or all of the following problems from allergies.  Swelling and itching in and around the mouth.   Tearing, itchy eyes.   Nasal congestion and runny nose.   Sneezing and coughing.   An itchy red rash or hives.   Vomiting or diarrhea.   Difficulty breathing.  Seasonal allergies occur in all age groups. They are seasonal because they usually occur during the same season every year. They may be a reaction to molds, grass pollens, or tree pollens. Other causes of allergies are house dust mite allergens, pet dander and mold spores. These are just a common few of the thousands of allergens around [place]. All of the symptoms listed above happen when you come in contact with pollens and other allergens. Seasonal allergies are usually not life threatening. They are generally more of a nuisance that can often be handled using medications. Hay fever is a combination of all or some of the above  listed allergy problems. It may often be treated with simple over-the-counter medications such as diphenhydramine. Take medication as directed. Check with your caregiver or package insert for child dosages. TREATMENT AND HOME CARE INSTRUCTIONS If hives or rash are present:  Take medications as directed.   You may use an over-the-counter antihistamine (diphenhydramine) for hives and itching as needed. Do not drive or drink alcohol until medications used to treat the reaction have worn off. Antihistamines tend to make people sleepy.   Apply cold cloths (compresses) to the skin or take baths in cool water. This will help itching. Avoid hot baths or showers. Heat will make a rash and itching worse.   If your allergies persist and become more severe, and over the counter medications are not effective, there are many new medications your caretaker can prescribe. Immunotherapy or desensitizing injections can be used if all else fails. Follow up with your caregiver if problems continue.  SEEK MEDICAL CARE IF:   Your allergies are becoming progressively more troublesome.   You suspect a food allergy. Symptoms generally happen within 30 minutes of eating a food.   Your symptoms have not gone away within 2 days or are getting worse.   You develop new symptoms.   You want to retest yourself or your child with a food or drink you think causes an allergic reaction. Never test yourself or your child of a suspected allergy without being under the watchful eye of your caregivers. A second exposure to an allergen may be life-threatening.  SEEK IMMEDIATE MEDICAL CARE IF:  You develop difficulty breathing or wheezing, or have a tight feeling in your chest or throat.   You develop a swollen mouth, hives, swelling, or itching all over your body.  A severe reaction with any of the above problems should be considered life-threatening. If you suddenly develop difficulty breathing call for local emergency medical  help. THIS IS AN EMERGENCY. MAKE SURE YOU:   Understand these instructions.   Will watch your condition.   Will get help right away if you are not doing well or get worse.  Document Released: 03/08/2007 Document Revised: 04/30/2011 Document Reviewed: 03/08/2007 [facility] Patient Information 2012 [place], [place].     Stay off your Simvastatin for the rest of this week then restart the medication. If the swelling occurs again, contact the office. If you have a problem off the Simvastatin, then your are not allergic to Simvastatin.

## 2011-12-03 ENCOUNTER — Encounter: Payer: Self-pay | Admitting: Family Medicine

## 2011-12-03 ENCOUNTER — Ambulatory Visit (INDEPENDENT_AMBULATORY_CARE_PROVIDER_SITE_OTHER): Payer: Medicare Other | Admitting: Family Medicine

## 2011-12-03 VITALS — BP 152/80 | HR 79 | Temp 96.9°F | Resp 16 | Ht 71.0 in | Wt 185.6 lb

## 2011-12-03 DIAGNOSIS — I10 Essential (primary) hypertension: Secondary | ICD-10-CM

## 2011-12-03 DIAGNOSIS — J309 Allergic rhinitis, unspecified: Secondary | ICD-10-CM | POA: Insufficient documentation

## 2011-12-03 LAB — COMPREHENSIVE METABOLIC PANEL
AST: 12 U/L (ref 0–37)
Albumin: 4.3 g/dL (ref 3.5–5.2)
BUN: 13 mg/dL (ref 6–23)
CO2: 25 mEq/L (ref 19–32)
Calcium: 9.9 mg/dL (ref 8.4–10.5)
Chloride: 104 mEq/L (ref 96–112)
Creat: 0.91 mg/dL (ref 0.50–1.35)
Glucose, Bld: 118 mg/dL — ABNORMAL HIGH (ref 70–99)

## 2011-12-03 LAB — POCT GLYCOSYLATED HEMOGLOBIN (HGB A1C): Hemoglobin A1C: 6.8

## 2011-12-03 LAB — LDL CHOLESTEROL, DIRECT: Direct LDL: 74 mg/dL

## 2011-12-03 MED ORDER — NITROGLYCERIN 0.4 MG SL SUBL: 0.4000 mg | SUBLINGUAL_TABLET | SUBLINGUAL | Status: DC | PRN

## 2011-12-03 NOTE — Progress Notes (Signed)
  Subjective:    Patient ID: Joshua Ray, male    DOB: 12/23/1940, 71 y.o.   MRN: [id number]  HPI  This 71 y.o. Cauc male has Type 2 Diabetes was out of medication for 2 weeks in June while  at the beach. Has not been following a meal plan. FSBS: 80-150. Pt was seen by an Allergist who  who prescribed Montelukast Sodium (Singulair) 10 mg and an antibiotic (generic Augmentin); pt  has follow-up next week.    Review of Systems  Constitutional: Negative.   HENT:       Recent sinus infection  Eyes: Negative for visual disturbance.  Respiratory: Negative for cough, chest tightness and shortness of breath.   Cardiovascular: Negative for chest pain and palpitations.  Neurological: Negative for dizziness, weakness, numbness and headaches.       Objective:   Physical Exam  Nursing note and vitals reviewed. Constitutional: He is oriented to person, place, and time. He appears well-developed and well-nourished. No distress.  HENT:  Head: Normocephalic and atraumatic.       Sinuses nontender with percussion; oropharynx erythematous (no exudate) with cobblestoning  Eyes: Conjunctivae and EOM are normal. No scleral icterus.  Cardiovascular: Normal rate, regular rhythm and normal heart sounds.  Exam reveals no gallop.   No murmur heard. Pulmonary/Chest: Effort normal and breath sounds normal. No respiratory distress. He has no wheezes.  Neurological: He is alert and oriented to person, place, and time. No cranial nerve deficit.    Results for orders placed in visit on 12/03/11  POCT GLYCOSYLATED HEMOGLOBIN (HGB A1C)      Component Value Range   Hemoglobin A1C 6.8           Assessment & Plan:   1. Type II or unspecified type diabetes mellitus without mention of complication, uncontrolled  Comprehensive metabolic panel, LDL Cholesterol  Encourage meal planning/ portion control, staying active while vacationing at the beach; avoid missing doses of medications (has RFs on all meds)    2. HTN, goal below 130/80 -inadequate control Continue current medication and encourage weight reduction (continue daily walking with dogs)

## 2011-12-04 NOTE — Progress Notes (Signed)
Quick Note:  Please notify pt that results are normal.   Provide pt with copy of labs. ______ 

## 2011-12-08 ENCOUNTER — Other Ambulatory Visit: Payer: Self-pay | Admitting: Family Medicine

## 2012-01-04 ENCOUNTER — Other Ambulatory Visit: Payer: Self-pay | Admitting: Family Medicine

## 2012-01-15 ENCOUNTER — Encounter: Payer: Self-pay | Admitting: Family Medicine

## 2012-01-15 ENCOUNTER — Ambulatory Visit (INDEPENDENT_AMBULATORY_CARE_PROVIDER_SITE_OTHER): Payer: Medicare Other | Admitting: Family Medicine

## 2012-01-15 VITALS — BP 138/70 | HR 89 | Temp 97.1°F | Resp 18 | Ht 71.0 in | Wt 190.0 lb

## 2012-01-15 DIAGNOSIS — K1120 Sialoadenitis, unspecified: Secondary | ICD-10-CM

## 2012-01-15 MED ORDER — AMOXICILLIN 500 MG PO CAPS: ORAL_CAPSULE | ORAL | Status: DC

## 2012-01-15 NOTE — Patient Instructions (Signed)
Parotitis  Parotitis is an inflammation of one or both parotid glands. This is the main salivary gland. It lies behind the angle of the jaw and below the ear lobe. The saliva produced comes out of a tiny opening (duct) inside the cheek on either side. It is usually at the level of the upper back teeth. If the parotid gland is swollen, the ear is pushed up and out in a particular way. This helps set this condition apart from a simple lymph gland infection in the same area.  CAUSES   Cases of mumps have mostly disappeared since the start of immunization against mumps. Currently, the most common causes of parotitis are:   Germ (bacterial) infection.   Inflammation of the lymph channels (lymphatics).  Other Uncommon Causes of Parotitis:   Sjogren's syndrome. A condition in which arthritis is associated with a decrease in activity of the glands of the body that produce saliva and tears. Some people are bothered by a dry mouth and intermittent salivary gland enlargement. The diagnosis is made with blood tests or by examination of a piece of tissue from the inside of the lip.   Atypical mycobacteria. Can give rise to a condition that usually infects children. It is a germ similar to tuberculosis. It is often resistant to antibiotic treatment. It may require surgical treatment and removal of the infected salivary gland.   Actinomycosis. An infection of the parotid gland that may also involve the overlying skin. The diagnosis is made by detecting granules of sulphur, produced by the bacteria, on microscopic examination. Treatment is with a prolonged course of penicillin for up to one year.  Acute (Sudden Onset) Bacertial Parotitis  This is a sudden inflammatory response to bacterial infection that causes:   Redness (erythema).   Pain.   Swelling.   Tenderness over the gland on the side of the cheek.   The appearance of pus from the opening of the duct on the inside of the cheek.  It used to be common in dehydrated  and debilitated patients, and often following surgery. It is now more commonly seen after radiotherapy (X-ray treatment) or in patients with a poorly working immune system. Treatment includes:    Correction of the lack of fluids (rehydration).   Medications which kill germs(antibiotics).   Pain relief.  Chronic Recurrent Parotitis  This refers to repeated episodes of discomfort and swelling of the parotid gland. This occurs often after eating. It is caused by decreased flow of saliva. This is often due to either blockage of the duct by a stone or the formation of a duct narrowing. It is treated with:    Gland massage.   Methods to stimulate the flow of saliva (for example, giving lemon juice).   Antibiotics if required.  Surgery to remove the gland is possible. The benefits of surgery need to be balanced against the risk of damage to the facial nerve. The facial nerve allows the muscles of facial expression to function. Damage to this can cause paralysis of one side of the face. X-ray treatment (radiotherapy) and treatment with steroid tablets have been considered. But they are thought to be ineffective.  Viral Parotitis  The most common viral cause of parotitis is mumps. It usually affects 4 to 10 year olds. It causes painful swelling of both parotid glands.  Recurrent Parotitis in Children  This condition is thought to be due to swelling or ballooning of the ducts (ectasia). It results in the same problems(symptoms ) as acute   bacterial parotitis. It is usually caused by bacteria called streptococci that is treated with penicillin. It usually gets well by itself without treatment (self-limiting). Surgery is usually not needed.  Tuberculosis  The parotid glands may become infected with the same bacteria causing tuberculosis (TB). Treatment is with anti-tuberculous antibiotic therapy.  HOME CARE INSTRUCTIONS    Apply ice bags about every 2 hours, for 15 to 20 minutes, while awake, to the sore gland. Place ice  in a plastic bag with a towel around it to prevent frostbite to skin. Continue for 24 hours and then as directed by your caregiver.   Only take over-the-counter or prescription medicines for pain, discomfort, or fever as directed by your caregiver.  SEEK IMMEDIATE MEDICAL CARE IF:    There is increased pain or swelling in your gland that is not controlled with medication.   You have a fever.  Document Released: 10/31/2001 Document Revised: 04/30/2011 Document Reviewed: 04/06/2011  [employer] Patient Information 2012 [employer].

## 2012-01-15 NOTE — Progress Notes (Signed)
S: Pt had acute onset of swelling below left ear lobe. Had some Amoxicilllin 500 mg left from March 2013 visit and took 2 last night and 2 this morning. The swelling and pain are less but still has discomfort when chewing. States dogs woke him this morning sniffing at that ear (never happened before). Denies fever, HA, nausea, vomiting, impaired hearing.  O:  Filed Vitals:   01/15/12 1505  BP: 138/70  Pulse: 89  Temp: 97.1 F (36.2 C)  Resp: 18   GEN: In NAD; WN,WD HENT: EOMI, conj/scl clear; Left EAC erythematous without lesion; minimal cerumen; posterior pharynx erythematous.            Left parotid gland not swollen but is tender. NECK: No cervical LAN LUNGS: Normal resp rate and effort NEURO: Nonfocal     A/P: 1. Parotitis - RX: Amoxicillin 500 mg  #30  2 capsules twice a day   0RFs  RTC if symptoms worsen

## 2012-02-03 ENCOUNTER — Other Ambulatory Visit: Payer: Self-pay | Admitting: Family Medicine

## 2012-03-03 ENCOUNTER — Other Ambulatory Visit: Payer: Self-pay | Admitting: Physician Assistant

## 2012-04-06 ENCOUNTER — Ambulatory Visit: Payer: Medicare Other | Admitting: Family Medicine

## 2012-04-07 ENCOUNTER — Other Ambulatory Visit: Payer: Self-pay | Admitting: Physician Assistant

## 2012-04-08 ENCOUNTER — Encounter: Payer: Self-pay | Admitting: Family Medicine

## 2012-04-08 ENCOUNTER — Ambulatory Visit (INDEPENDENT_AMBULATORY_CARE_PROVIDER_SITE_OTHER): Payer: Medicare Other | Admitting: Family Medicine

## 2012-04-08 VITALS — BP 130/66 | HR 82 | Temp 97.9°F | Resp 16 | Ht 70.75 in | Wt 187.2 lb

## 2012-04-08 DIAGNOSIS — E7800 Pure hypercholesterolemia, unspecified: Secondary | ICD-10-CM

## 2012-04-08 LAB — POCT GLYCOSYLATED HEMOGLOBIN (HGB A1C): Hemoglobin A1C: 8.1

## 2012-04-08 MED ORDER — METOPROLOL SUCCINATE ER 50 MG PO TB24: 50.0000 mg | ORAL_TABLET | Freq: Every day | ORAL | Status: DC

## 2012-04-08 MED ORDER — GLYBURIDE 5 MG PO TABS: 5.0000 mg | ORAL_TABLET | Freq: Two times a day (BID) | ORAL | Status: DC

## 2012-04-08 MED ORDER — SIMVASTATIN 20 MG PO TABS: 20.0000 mg | ORAL_TABLET | Freq: Every evening | ORAL | Status: DC

## 2012-04-08 MED ORDER — METFORMIN HCL 1000 MG PO TABS: 1000.0000 mg | ORAL_TABLET | Freq: Two times a day (BID) | ORAL | Status: DC

## 2012-04-08 NOTE — Patient Instructions (Addendum)
Administration of current medications:  Try to take Montelukast (Singulair) 10 mg tablet at or near bedtime.  Also, it would be best to take Simvastatin (cholesterol medication) in the evening after supper. If you think you will forget the medication by changing when you take it, then continue to take it as you have been.  You have to work on better nutrition; as we discussed, make up a trail mix for snacking . Stop eating pure candy to correct low blood sugar.  I will see you again in 3 months for Diabetes recheck.

## 2012-04-09 LAB — LIPID PANEL
Cholesterol: 147 mg/dL (ref 0–200)
VLDL: 15 mg/dL (ref 0–40)

## 2012-04-10 NOTE — Progress Notes (Signed)
S: This 71 y.o. Cauc male is here for DM follow-up. He admits noncompliance with nutrition; sweets are his weakness. He tends to snack on high-carb foods or sweets. FSBS= 120=170. Denies hypoglycemia. Pt remains active with his job; his grandchildren and his dogs.  ROS: Negative for diaphoresis, fatigue, fever/ chills, unexplained weight loss, appetite change, CP or tightness, palpitations, SOB or cough, edema, AH, dizziness, weakness, tremor, numbness or syncope. He does have chronic allergies treated most recently with Montelukast.  O: Filed Vitals:   04/08/12 0758  BP: 130/66  Pulse: 82  Temp: 97.9 F (36.6 C)  Resp: 16   GEN: In NAD; WN,WD. HEENT: [place]/AT; EOMI w/ clear conj/ scl. EACs/TMs normal. Post ph normal w/ mild erythema. COR: RRR. LUNGS: Normal resp rate and effort. NEURO; A&O x 3; CNs intact. Nonfocal  A1c= 8.1 %  (up from 6.8%)   A/P: 1. Type II or unspecified type diabetes mellitus without mention of complication, uncontrolled  Advised pt about healthy snacks and following a meal plan (portion sizes, etc.); pt understands. Continue Metformin 1000 mg 1 tablet bid with meals  2. Pure hypercholesterolemia  Lipid panel, ALT

## 2012-04-12 NOTE — Progress Notes (Signed)
Quick Note:  Please call pt and advise that the following labs are normal... Lipids panel looks great!  Please work on improving nutrition !!!   Copy to pt.   ______

## 2012-05-05 ENCOUNTER — Other Ambulatory Visit: Payer: Self-pay | Admitting: Family Medicine

## 2012-06-01 ENCOUNTER — Other Ambulatory Visit: Payer: Self-pay | Admitting: Family Medicine

## 2012-07-08 ENCOUNTER — Ambulatory Visit: Payer: Medicare Other | Admitting: Family Medicine

## 2012-07-14 ENCOUNTER — Ambulatory Visit (INDEPENDENT_AMBULATORY_CARE_PROVIDER_SITE_OTHER): Payer: Medicare Other | Admitting: Family Medicine

## 2012-07-14 ENCOUNTER — Encounter: Payer: Self-pay | Admitting: Family Medicine

## 2012-07-14 VITALS — BP 162/76 | HR 80 | Temp 97.7°F | Resp 16 | Ht 70.0 in | Wt 188.8 lb

## 2012-07-14 LAB — POCT GLYCOSYLATED HEMOGLOBIN (HGB A1C): Hemoglobin A1C: 8.2

## 2012-07-14 MED ORDER — GLYBURIDE 5 MG PO TABS: 5.0000 mg | ORAL_TABLET | Freq: Two times a day (BID) | ORAL | Status: DC

## 2012-07-14 MED ORDER — MONTELUKAST SODIUM 10 MG PO TABS: 10.0000 mg | ORAL_TABLET | Freq: Every day | ORAL | Status: DC

## 2012-07-14 MED ORDER — SIMVASTATIN 20 MG PO TABS: 20.0000 mg | ORAL_TABLET | Freq: Every day | ORAL | Status: DC

## 2012-07-14 MED ORDER — METOPROLOL SUCCINATE ER 50 MG PO TB24: 50.0000 mg | ORAL_TABLET | Freq: Every day | ORAL | Status: DC

## 2012-07-14 MED ORDER — METFORMIN HCL 1000 MG PO TABS: 1000.0000 mg | ORAL_TABLET | Freq: Two times a day (BID) | ORAL | Status: DC

## 2012-07-14 NOTE — Progress Notes (Signed)
S:  This 72 y.o. Cauc male has Type II DM; not checking FSBS regularly but reports some values ~ 80-90  And no values > 200. He feels well and is compliant w/ medications. He thinks his A1c will be "high". He still works and stays active w/ his 2 small dogs. He has a hx of PAD which was diagnosed at the time CAD was being evaluated; he continues to smoke 1/2 ppd.   Pt has a skin lesion on L lower jaw that he wants removed; he also comments on a lesion on his nose, present ~ 3 months. He plans to schedule an appt w/ Dr. Terri Piedra; he saw this dermatologist years ago.  ROS: Negative for  Fatigue, unexplained weight change, CP or tightness, palpitations, SOB or DOE, cough, edema, HA, dizziness, numbness, weakness or syncope.  O:  Filed Vitals:   07/14/12 0857  BP: 162/76  Pulse: 80  Temp: 97.7 F (36.5 C)  Resp: 16   GEN: In NAD; WN,WD. HENT: [place]/AT; EOMI w/ clear conj/ sclerae. Oroph clear and moist. COR: RRR; distal pulses (feet)- 0-1+ TD and DP bilaterally. Tips of toes are cool and mildly cyanotic. LUNGS: Normal resp rate and effort. SKIN: bridge of nose- 5 mm erythematous scaly lesion w/ central ulceration on L; 1 cm slightly raised scaly pigmented lesion (c/w Seb.K) EXT: MAEs;  R hand missing 1 st digit; thumb tender at MCP w/o redness or significant deformity.  See DM foot exam. NEURO: A&O x 3; CNs intact. Nonfocal.   Results for orders placed in visit on 07/14/12  POCT GLYCOSYLATED HEMOGLOBIN (HGB A1C)      Result Value Range   Hemoglobin A1C 8.2       A/P:  Type II or unspecified type diabetes mellitus without mention of complication, uncontrolled - Plan: Lengthy discussion re: nutritional changes pt needs to address. Weight reduction.  Thumb pain, right- conservative treatment for overuse syndrome.  Facial skin lesion- DERM referral. Pt will contact specialist to sch appt.  Tobacco user- advised cessation.

## 2012-07-14 NOTE — Patient Instructions (Addendum)
Diets for Diabetes, Food Labeling Look at food labels to help you decide how much of a product you can eat. You will want to check the amount of total carbohydrate in a serving to see how the food fits into your meal plan. In the list of ingredients, the ingredient present in the largest amount by weight must be listed first, followed by the other ingredients in descending order. STANDARD OF IDENTITY Most products have a list of ingredients. However, foods that the Food and Drug Administration (FDA) has given a standard of identity do not need a list of ingredients. A standard of identity means that a food must contain certain ingredients if it is called a particular name. Examples are mayonnaise, peanut butter, ketchup, jelly, and cheese. LABELING TERMS There are many terms found on food labels. Some of these terms have specific definitions. Some terms are regulated by the FDA, and the FDA has clearly specified how they can be used. Others are not regulated or well-defined and can be misleading and confusing. SPECIFICALLY DEFINED TERMS Nutritive Sweetener.  A sweetener that contains calories,such as table sugar or honey. Nonnutritive Sweetener.  A sweetener with few or no calories,such as saccharin, aspartame, sucralose, and cyclamate. LABELING TERMS REGULATED BY THE FDA Free.  The product contains only a tiny or small amount of fat, cholesterol, sodium, sugar, or calories. For example, a "fat-free" product will contain less than 0.5 g of fat per serving. Low.  A food described as "low" in fat, saturated fat, cholesterol, sodium, or calories could be eaten fairly often without exceeding dietary guidelines. For example, "low in fat" means no more than 3 g of fat per serving. Lean.  "Lean" and "extra lean" are U.S. Department of Agriculture (USDA) terms for use on meat and poultry products. "Lean" means the product contains less than 10 g of fat, 4 g of saturated fat, and 95 mg of cholesterol  per serving. "Lean" is not as low in fat as a product labeled "low." Extra Lean.  "Extra lean" means the product contains less than 5 g of fat, 2 g of saturated fat, and 95 mg of cholesterol per serving. While "extra lean" has less fat than "lean," it is still higher in fat than a product labeled "low." Reduced, Less, Fewer.  A diet product that contains 25% less of a nutrient or calories than the regular version. For example, hot dogs might be labeled "25% less fat than our regular hot dogs." Light/Lite.  A diet product that contains  fewer calories or  the fat of the original. For example, "light in sodium" means a product with  the usual sodium. More.  One serving contains at least 10% more of the daily value of a vitamin, mineral, or fiber than usual. Good Source Of.  One serving contains 10% to 19% of the daily value for a particular vitamin, mineral, or fiber. Excellent Source Of.  One serving contains 20% or more of the daily value for a particular nutrient. Other terms used might be "high in" or "rich in." Enriched or Fortified.  The product contains added vitamins, minerals, or protein. Nutrition labeling must be used on enriched or fortified foods. Imitation.  The product has been altered so that it is lower in protein, vitamins, or minerals than the usual food,such as imitation peanut butter. Total Fat.  The number listed is the total of all fat found in a serving of the product. Under total fat, food labels must list saturated fat and   trans fat, which are associated with raising bad cholesterol and an increased risk of heart blood vessel disease. Saturated Fat.  Mainly fats from animal-based sources. Some examples are red meat, cheese, cream, whole milk, and coconut oil. Trans Fat.  Found in some fried snack foods, packaged foods, and fried restaurant foods. It is recommended you eat as close to 0 g of trans fat as possible, since it raises bad cholesterol and lowers  good cholesterol. Polyunsaturated and Monounsaturated Fats.  More healthful fats. These fats are from plant sources. Total Carbohydrate.  The number of carbohydrate grams in a serving of the product. Under total carbohydrate are listed the other carbohydrate sources, such as dietary fiber and sugars. Dietary Fiber.  A carbohydrate from plant sources. Sugars.  Sugars listed on the label contain all naturally occurring sugars as well as added sugars. LABELING TERMS NOT REGULATED BY THE FDA Sugarless.  Table sugar (sucrose) has not been added. However, the manufacturer may use another form of sugar in place of sucrose to sweeten the product. For example, sugar alcohols are used to sweeten foods. Sugar alcohols are a form of sugar but are not table sugar. If a product contains sugar alcohols in place of sucrose, it can still be labeled "sugarless." Low Salt, Salt-Free, Unsalted, No Salt, No Salt Added, Without Added Salt.  Food that is usually processed with salt has been made without salt. However, the food may contain sodium-containing additives, such as preservatives, leavening agents, or flavorings. Natural.  This term has no legal meaning. Organic.  Foods that are certified as organic have been inspected and approved by the USDA to ensure they are produced without pesticides, fertilizers containing synthetic ingredients, bioengineering, or ionizing radiation. Document Released: 05/14/2003 Document Revised: 08/03/2011 Document Reviewed: 11/29/2008 [employer] Patient Information 2013 [employer].  

## 2012-09-28 ENCOUNTER — Other Ambulatory Visit: Payer: Self-pay | Admitting: Family Medicine

## 2012-11-01 ENCOUNTER — Other Ambulatory Visit: Payer: Self-pay | Admitting: Physician Assistant

## 2012-11-01 ENCOUNTER — Other Ambulatory Visit: Payer: Self-pay | Admitting: Family Medicine

## 2012-11-01 NOTE — Telephone Encounter (Signed)
Due for labs, last seen Feb 2014

## 2012-11-11 ENCOUNTER — Ambulatory Visit (INDEPENDENT_AMBULATORY_CARE_PROVIDER_SITE_OTHER): Payer: Medicare Other | Admitting: Family Medicine

## 2012-11-11 ENCOUNTER — Encounter: Payer: Self-pay | Admitting: Family Medicine

## 2012-11-11 VITALS — BP 160/80 | HR 82 | Temp 97.8°F | Resp 16 | Ht 70.0 in | Wt 185.6 lb

## 2012-11-11 DIAGNOSIS — E7800 Pure hypercholesterolemia, unspecified: Secondary | ICD-10-CM

## 2012-11-11 DIAGNOSIS — I10 Essential (primary) hypertension: Secondary | ICD-10-CM

## 2012-11-11 LAB — BASIC METABOLIC PANEL
BUN: 10 mg/dL (ref 6–23)
CO2: 23 mEq/L (ref 19–32)
Glucose, Bld: 163 mg/dL — ABNORMAL HIGH (ref 70–99)
Potassium: 3.8 mEq/L (ref 3.5–5.3)
Sodium: 137 mEq/L (ref 135–145)

## 2012-11-11 LAB — LDL CHOLESTEROL, DIRECT: Direct LDL: 93 mg/dL

## 2012-11-11 MED ORDER — METFORMIN HCL 1000 MG PO TABS: 1000.0000 mg | ORAL_TABLET | Freq: Two times a day (BID) | ORAL | Status: DC

## 2012-11-11 MED ORDER — METOPROLOL SUCCINATE ER 50 MG PO TB24: 50.0000 mg | ORAL_TABLET | Freq: Every day | ORAL | Status: DC

## 2012-11-11 MED ORDER — MONTELUKAST SODIUM 10 MG PO TABS: 10.0000 mg | ORAL_TABLET | Freq: Every day | ORAL | Status: DC

## 2012-11-11 MED ORDER — GLYBURIDE 5 MG PO TABS: 5.0000 mg | ORAL_TABLET | Freq: Two times a day (BID) | ORAL | Status: DC

## 2012-11-11 MED ORDER — SIMVASTATIN 20 MG PO TABS: 20.0000 mg | ORAL_TABLET | Freq: Every day | ORAL | Status: DC

## 2012-11-11 NOTE — Patient Instructions (Signed)

## 2012-11-11 NOTE — Progress Notes (Signed)
Subjective:    Patient ID: Joshua Ray, male    DOB: [birth year], 72 y.o.   MRN: [id number]  HPI  This 72 y.o. Cauc male is scheduled for CPE but, upon arrival, decides that he does not   need CPE (and is actually  Somewhat uncooperative w/ staff). He admits that some personal  issues are affecting his mood today.   Pt sees Dr. Brunilda Payor routinely for prostate issues. He used to see Dr. Riley Kill re: CAD but that  specialist is no longer part of this pt's care team. Pt states that physician is no longer in   [place].   Re: HTN- BP readings at home= 150-160/ 80-90. Pt is asymptomatic. He is compliant with  medications w/o adverse effects.    Re: Type II DM- FSBS= 60-160. Pt denies hypoglycemic episodes. He does not follow a meal-  plan. He stays active with full time employment and 2 small dogs.    Patient Active Problem List   Diagnosis Date Noted  . Allergic rhinitis 12/03/2011  . Nicotine addiction 08/18/2011  . DM, UNCOMPLICATED, TYPE II, UNCONTROLLED 11/03/2006  . HYPERCHOLESTEROLEMIA, PURE 11/03/2006  . DISORDER, TOBACCO USE 11/03/2006  . HYPERTENSION, ESSENTIAL NOS 11/03/2006  . CAD 11/03/2006   PMHx, Soc Hx and Fam Hx reviewed.   Review of Systems  Constitutional: Negative.   HENT: Positive for tinnitus.   Eyes: Negative.   Respiratory: Negative.   Cardiovascular: Negative.   Gastrointestinal: Negative.   Endocrine: Negative.   Genitourinary: Negative.   Musculoskeletal: Negative.   Skin: Negative.   Allergic/Immunologic: Negative.   Neurological: Negative.   Hematological: Negative.   Psychiatric/Behavioral: Negative.        Objective:   Physical Exam  Nursing note and vitals reviewed. Constitutional: He is oriented to person, place, and time. He appears well-developed and well-nourished. No distress.  HENT:  Head: Normocephalic and atraumatic.  Right Ear: Hearing, external ear and ear canal normal. Tympanic membrane is scarred.  Left Ear: Hearing,  external ear and ear canal normal. Tympanic membrane is scarred.  Nose: Nose normal. No mucosal edema, rhinorrhea, nasal deformity or septal deviation.  Mouth/Throat: Uvula is midline, oropharynx is clear and moist and mucous membranes are normal. No oral lesions. Normal dentition.  Eyes: Conjunctivae, EOM and lids are normal. Pupils are equal, round, and reactive to light. No scleral icterus.  Periodic vision evaluation performed by eye care specialist.  Neck: Normal range of motion, full passive range of motion without pain and phonation normal. Neck supple. No JVD present. No tracheal tenderness and no muscular tenderness present. Carotid bruit is not present. Normal range of motion present. No mass and no thyromegaly present.  Cardiovascular: Normal rate, regular rhythm and normal heart sounds.  Exam reveals no gallop and no friction rub.   No murmur heard. Pulmonary/Chest: Effort normal and breath sounds normal. No respiratory distress. He has no wheezes.  Abdominal: Soft. Normal appearance. He exhibits no distension and no mass. There is no hepatosplenomegaly. There is no tenderness. There is no guarding and no CVA tenderness. No hernia.  Musculoskeletal: Normal range of motion. He exhibits no edema.  Lymphadenopathy:    He has no cervical adenopathy.  Neurological: He is alert and oriented to person, place, and time. He has normal reflexes. No cranial nerve deficit. He exhibits normal muscle tone. Coordination normal.  Skin: Skin is warm and dry. No erythema. No pallor.  Psychiatric: He has a normal mood and affect. His behavior is normal. Judgment and  thought content normal.    ECG: NSR; no acute ST-TW changes. No ectopy.   Results for orders placed in visit on 11/11/12  POCT GLYCOSYLATED HEMOGLOBIN (HGB A1C)      Result Value Range   Hemoglobin A1C 8.1          Assessment & Plan:  HTN (hypertension) - Stable and controlled on current medications; continue current meds.    Plan:  Basic metabolic panel, EKG 12-Lead  Type II or unspecified type diabetes mellitus without mention of complication, uncontrolled - Encouraged better nutrition and active lifestyle.  No medication change.   Plan: POCT glycosylated hemoglobin (Hb A1C), EKG 12-Lead  Pure hypercholesterolemia - Plan: LDL Cholesterol, Direct   Meds ordered this encounter  Medications  . glyBURIDE (DIABETA) 5 MG tablet    Sig: Take 1 tablet (5 mg total) by mouth 2 (two) times daily.    Dispense:  60 tablet    Refill:  11  . metFORMIN (GLUCOPHAGE) 1000 MG tablet    Sig: Take 1 tablet (1,000 mg total) by mouth 2 (two) times daily with a meal.    Dispense:  60 tablet    Refill:  11  . metoprolol succinate (TOPROL-XL) 50 MG 24 hr tablet    Sig: Take 1 tablet (50 mg total) by mouth daily. Take with meal.    Dispense:  30 tablet    Refill:  11  . montelukast (SINGULAIR) 10 MG tablet    Sig: Take 1 tablet (10 mg total) by mouth at bedtime.    Dispense:  30 tablet    Refill:  11  . simvastatin (ZOCOR) 20 MG tablet    Sig: Take 1 tablet (20 mg total) by mouth at bedtime.    Dispense:  30 tablet    Refill:  11

## 2012-11-17 NOTE — Progress Notes (Signed)
Quick Note:  Please contact pt and advise that the following labs are abnormal...  Blood sugar elevated as expected; otherwise, labs are normal.   Copt to pt. ______

## 2012-12-28 ENCOUNTER — Other Ambulatory Visit: Payer: Self-pay | Admitting: Family Medicine

## 2013-02-14 ENCOUNTER — Ambulatory Visit (INDEPENDENT_AMBULATORY_CARE_PROVIDER_SITE_OTHER): Payer: Medicare Other | Admitting: *Deleted

## 2013-02-14 DIAGNOSIS — Z23 Encounter for immunization: Secondary | ICD-10-CM

## 2013-03-06 ENCOUNTER — Other Ambulatory Visit: Payer: Self-pay | Admitting: Physician Assistant

## 2013-04-04 ENCOUNTER — Other Ambulatory Visit: Payer: Self-pay | Admitting: Family Medicine

## 2013-04-05 ENCOUNTER — Telehealth: Payer: Self-pay

## 2013-04-05 NOTE — Telephone Encounter (Signed)
I cannot find this medication on profile or in the history; pt is scheduled to see me next week. We can discuss it at that time. I will not OK this medication at this time.

## 2013-04-05 NOTE — Telephone Encounter (Signed)
Received fax from [place] [facility] stating that they have an Alprazolam Rx written by Dr Audria Nine on 09/02/12 that was never filled and is out-of-date. They want permission to fill this now. Dr Audria Nine, is that OK?

## 2013-04-05 NOTE — Telephone Encounter (Signed)
Faxed back this info to pharm.

## 2013-04-13 ENCOUNTER — Encounter: Payer: Self-pay | Admitting: Family Medicine

## 2013-04-13 ENCOUNTER — Ambulatory Visit (INDEPENDENT_AMBULATORY_CARE_PROVIDER_SITE_OTHER): Payer: Medicare Other | Admitting: Family Medicine

## 2013-04-13 VITALS — BP 164/86 | HR 88 | Temp 98.8°F | Resp 18 | Ht 70.25 in | Wt 179.6 lb

## 2013-04-13 DIAGNOSIS — E7800 Pure hypercholesterolemia, unspecified: Secondary | ICD-10-CM

## 2013-04-13 DIAGNOSIS — Z23 Encounter for immunization: Secondary | ICD-10-CM

## 2013-04-13 DIAGNOSIS — E119 Type 2 diabetes mellitus without complications: Secondary | ICD-10-CM | POA: Insufficient documentation

## 2013-04-13 LAB — COMPREHENSIVE METABOLIC PANEL
ALT: 8 U/L (ref 0–53)
AST: 13 U/L (ref 0–37)
Alkaline Phosphatase: 56 U/L (ref 39–117)
BUN: 12 mg/dL (ref 6–23)
Calcium: 9.4 mg/dL (ref 8.4–10.5)
Creat: 0.86 mg/dL (ref 0.50–1.35)
Glucose, Bld: 154 mg/dL — ABNORMAL HIGH (ref 70–99)
Total Bilirubin: 0.7 mg/dL (ref 0.3–1.2)

## 2013-04-13 LAB — LIPID PANEL
HDL: 43 mg/dL (ref >39)
LDL Cholesterol: 84 mg/dL (ref 0–99)
Total CHOL/HDL Ratio: 3.3 Ratio
Triglycerides: 83 mg/dL (ref <150)
VLDL: 17 mg/dL (ref 0–40)

## 2013-04-13 LAB — POCT GLYCOSYLATED HEMOGLOBIN (HGB A1C): Hemoglobin A1C: 7

## 2013-04-13 NOTE — Progress Notes (Signed)
S:  This 72 y.o. Cauc male is here for DM follow-up. He is compliant w/ medications and feels well. FSBS= 130-180. Pt denies hypoglycemia but remarks that he can tell when his blood sugar is dropping; he eats a handful of M&Ms when that happens. Compliant w/ annual vision care.  Pt notes a lesion on his nose that is small and a little scaly. He has seen  Patient Active Problem List   Diagnosis Date Noted  . Allergic rhinitis 12/03/2011  . Nicotine addiction 08/18/2011  . DM, UNCOMPLICATED, TYPE II, UNCONTROLLED 11/03/2006  . HYPERCHOLESTEROLEMIA, PURE 11/03/2006  . DISORDER, TOBACCO USE 11/03/2006  . HYPERTENSION, ESSENTIAL NOS 11/03/2006  . CAD 11/03/2006   PMHx, Soc Hx and Fam Hx reviewed.  ROS: Negative for diaphoresis, fatigue, fever/chills, abnormal weight loss, appetite change, CP or tightness, palpitations, SOB or DOE, cough, GI problems, skin changes, HA, dizziness, weakness, numbness or syncope.   O: Filed Vitals:   04/13/13 0835  BP: 164/86  Pulse: 88  Temp: 98.8 F (37.1 C)  Resp: 18   GEN: In NAD; WN,WD. HENT: [place]/AT; EOMI w/ clear conj/sclerae. Otherwise unremarkable. SKIN: W&D; intact w/o diaphoresis, rashes or pallor. Pt points out a 2 mm slightly erythematous scaly lesion on L side of bridge of nose; no ulceration visible. COR: RRR. LUNGS: Unlabored resp. NEURO: A&O x 3; CNs intact. Nonfocal.   Results for orders placed in visit on 04/13/13  POCT GLYCOSYLATED HEMOGLOBIN (HGB A1C)      Result Value Range   Hemoglobin A1C 7.0      A/P: DM, UNCOMPLICATED, TYPE II, CONTROLLED - Much improved; continue current medications and lifestyle practices.   Plan: Comprehensive metabolic panel, POCT glycosylated hemoglobin (Hb A1C)  HYPERCHOLESTEROLEMIA, PURE - Plan: Lipid panel  Need for prophylactic vaccination with combined diphtheria-tetanus-pertussis (DTP) vaccine - Plan: Tdap vaccine greater than or equal to 7yo IM

## 2013-04-18 NOTE — Progress Notes (Signed)
Quick Note:  Please notify pt that results are normal.   Provide pt with copy of labs. ______ 

## 2013-09-27 ENCOUNTER — Telehealth: Payer: Self-pay | Admitting: *Deleted

## 2013-09-27 NOTE — Telephone Encounter (Signed)
Pt called in to inform us that he changed the company that he gets his lancets and test strips from. States that he will be getting them from [facility] and he stated that they should be sending you a prescription soon. He just wanted to make you aware .

## 2013-10-03 ENCOUNTER — Other Ambulatory Visit: Payer: Self-pay | Admitting: *Deleted

## 2013-10-05 NOTE — Telephone Encounter (Signed)
Received order and Medicare questions for DM supplies from [facility]. I have completed and sent to Dr Leward Quan through [facility] to sign and fax.

## 2013-10-13 ENCOUNTER — Ambulatory Visit (INDEPENDENT_AMBULATORY_CARE_PROVIDER_SITE_OTHER): Payer: BLUE CROSS/BLUE SHIELD | Admitting: Family Medicine

## 2013-10-13 ENCOUNTER — Telehealth: Payer: Self-pay

## 2013-10-13 ENCOUNTER — Encounter: Payer: Self-pay | Admitting: Family Medicine

## 2013-10-13 VITALS — BP 159/84 | HR 91 | Temp 97.8°F | Resp 16 | Ht 70.5 in | Wt 178.0 lb

## 2013-10-13 DIAGNOSIS — I739 Peripheral vascular disease, unspecified: Secondary | ICD-10-CM

## 2013-10-13 DIAGNOSIS — E119 Type 2 diabetes mellitus without complications: Secondary | ICD-10-CM

## 2013-10-13 DIAGNOSIS — F17200 Nicotine dependence, unspecified, uncomplicated: Secondary | ICD-10-CM

## 2013-10-13 DIAGNOSIS — I10 Essential (primary) hypertension: Secondary | ICD-10-CM

## 2013-10-13 LAB — BASIC METABOLIC PANEL
BUN: 13 mg/dL (ref 6–23)
CO2: 23 meq/L (ref 19–32)
Calcium: 9.2 mg/dL (ref 8.4–10.5)
Chloride: 105 mEq/L (ref 96–112)
Creat: 0.94 mg/dL (ref 0.50–1.35)
Glucose, Bld: 142 mg/dL — ABNORMAL HIGH (ref 70–99)
Potassium: 3.9 mEq/L (ref 3.5–5.3)
SODIUM: 139 meq/L (ref 135–145)

## 2013-10-13 LAB — TSH: TSH: 1.242 u[IU]/mL (ref 0.350–4.500)

## 2013-10-13 LAB — POCT GLYCOSYLATED HEMOGLOBIN (HGB A1C): HEMOGLOBIN A1C: 7.5

## 2013-10-13 LAB — LDL CHOLESTEROL, DIRECT: Direct LDL: 91 mg/dL

## 2013-10-13 NOTE — Telephone Encounter (Signed)
Completed/faxed DM supply Medicare form from [facility]

## 2013-10-13 NOTE — Progress Notes (Signed)
S:  This 73 y.o. 11 male is here for DM and HTN follow-up. He feels well and has lost weight. He attributes this to reduction of beer consumption on the weekends. He is compliant w/ medications w/o adverse effects. He has a scheduled follow-up w/ Urology (lab next week). He declines CPE today. Immunizations current. Tobacco use continues.  Patient Active Problem List   Diagnosis Date Noted  . TYPE II DIABETES MELLITUS WITHOUT MENTION OF COMPLICATION, NOT STATED AS UNCONTROLLED 04/13/2013  . Allergic rhinitis 12/03/2011  . Nicotine addiction 08/18/2011  . HYPERCHOLESTEROLEMIA, PURE 11/03/2006  . DISORDER, TOBACCO USE 11/03/2006  . HYPERTENSION, ESSENTIAL NOS 11/03/2006  . CAD- stents in place- mid 1990s 11/03/2006   Prior to Admission medications   Medication Sig Start Date End Date Taking? Authorizing Provider  aspirin 325 MG tablet Take 325 mg by mouth daily.   Yes Historical Provider, MD  cholecalciferol (VITAMIN D) 1000 UNITS tablet Take 1,000 Units by mouth daily.   Yes Historical Provider, MD  glyBURIDE (DIABETA) 5 MG tablet Take 1 tablet (5 mg total) by mouth 2 (two) times daily. 11/11/12  Yes Barton Fanny, MD  ipratropium (ATROVENT) 0.03 % nasal spray Place 2 sprays into the nose 2 (two) times daily. 07/19/11 10/13/13 Yes Chelle S Jeffery, PA-C  metFORMIN (GLUCOPHAGE) 1000 MG tablet Take 1 tablet (1,000 mg total) by mouth 2 (two) times daily with a meal. 11/11/12  Yes Barton Fanny, MD  metoprolol succinate (TOPROL-XL) 50 MG 24 hr tablet TAKE 1 TABLET (50 MG TOTAL) BY MOUTH DAILY. TAKE WITH MEAL. 12/28/12  Yes Barton Fanny, MD  montelukast (SINGULAIR) 10 MG tablet Take 1 tablet (10 mg total) by mouth at bedtime. 11/11/12  Yes Barton Fanny, MD  NITROSTAT 0.4 MG SL tablet PLACE 1 TABLET (0.4 MG TOTAL) UNDER THE TONGUE EVERY 5 (FIVE) MINUTES AS NEEDED. 04/04/13  Yes Barton Fanny, MD  simvastatin (ZOCOR) 20 MG tablet Take 1 tablet (20 mg total) by mouth at  bedtime. 11/11/12  Yes Barton Fanny, MD  vitamin B-12 (CYANOCOBALAMIN) 100 MCG tablet Take 400 mcg by mouth daily.   Yes Historical Provider, MD  vitamin E (VITAMIN E) 400 UNIT capsule Take 400 Units by mouth daily.   Yes Historical Provider, MD   PMHx, Surg Hx, Soc and Fam Hx reviewed.  ROS: Negative for fatigue, abnormal weight loss, anorexia, diaphoresis, sore throat, trouble swallowing, CP or tightness, palpitations, edema, SOB or cough, orthopnea, abd or back pain, HA, dizziness, numbness, weakness or syncope.  O: Filed Vitals:   10/13/13 0843  BP: 159/84  Pulse: 91  Temp: 97.8 F (36.6 C)  Resp: 16   GEN: In NAD; WN,WD. Weight stable (down 1 lb) HENT: [place]/AT; EOMI w/ clear conj/sclerae. Wears corrective lenses. Otherwise unremarkable. COR: RRR. Normal S1 and S2. No edema. LUNGS: CTA; unlabored resp. SKIN: W&D; intact w/o diaphoresis, erythema or rashes. See DM Foot exam. MS: MAEs; no c/c/e. No muscle atrophy. NEURO: A&O x 3; CNs intact. Nonfocal.   Results for orders placed in visit on 10/13/13  POCT GLYCOSYLATED HEMOGLOBIN (HGB A1C)      Result Value Ref Range   Hemoglobin A1C 7.5      A/P: Type II or unspecified type diabetes mellitus without mention of complication, not stated as uncontrolled - Continue current medications. Plan: HM Diabetes Foot Exam, POCT glycosylated hemoglobin (Hb A4Z), Basic metabolic panel, LDL Cholesterol, Direct, TSH  HYPERTENSION, ESSENTIAL NOS- Stable and controlled on current medications.  Nicotine addiction- Doubtful pt will quit. He has been advised about adverse effects.  PVD (peripheral vascular disease)- Advised abd ultrasound for AAA screening; pt agrees to have this scheduled at next visit.

## 2013-10-17 NOTE — Progress Notes (Signed)
Quick Note:  Please notify pt that results are normal.   Provide pt with copy of labs. ______ 

## 2013-10-18 ENCOUNTER — Encounter: Payer: Self-pay | Admitting: Family Medicine

## 2013-11-20 ENCOUNTER — Other Ambulatory Visit: Payer: Self-pay | Admitting: Family Medicine

## 2014-01-25 ENCOUNTER — Encounter: Payer: Medicare Other | Admitting: Family Medicine

## 2014-02-22 ENCOUNTER — Other Ambulatory Visit: Payer: Self-pay | Admitting: Family Medicine

## 2014-03-30 ENCOUNTER — Encounter: Payer: BLUE CROSS/BLUE SHIELD | Admitting: Family Medicine

## 2014-04-03 ENCOUNTER — Other Ambulatory Visit: Payer: Self-pay | Admitting: Family Medicine

## 2014-04-27 ENCOUNTER — Other Ambulatory Visit: Payer: Self-pay | Admitting: Family Medicine

## 2014-06-05 ENCOUNTER — Other Ambulatory Visit: Payer: Self-pay | Admitting: Physician Assistant

## 2014-06-05 ENCOUNTER — Other Ambulatory Visit: Payer: Self-pay | Admitting: Family Medicine

## 2014-06-07 ENCOUNTER — Encounter: Payer: BLUE CROSS/BLUE SHIELD | Admitting: Family Medicine

## 2014-06-29 ENCOUNTER — Other Ambulatory Visit: Payer: Self-pay | Admitting: Family Medicine

## 2014-07-12 ENCOUNTER — Ambulatory Visit (INDEPENDENT_AMBULATORY_CARE_PROVIDER_SITE_OTHER): Payer: Medicare Other | Admitting: Family Medicine

## 2014-07-12 ENCOUNTER — Encounter: Payer: Self-pay | Admitting: Family Medicine

## 2014-07-12 VITALS — BP 177/76 | HR 82 | Temp 97.9°F | Resp 16 | Ht 70.25 in | Wt 175.2 lb

## 2014-07-12 DIAGNOSIS — E119 Type 2 diabetes mellitus without complications: Secondary | ICD-10-CM | POA: Diagnosis not present

## 2014-07-12 DIAGNOSIS — Z0000 Encounter for general adult medical examination without abnormal findings: Secondary | ICD-10-CM

## 2014-07-12 DIAGNOSIS — C61 Malignant neoplasm of prostate: Secondary | ICD-10-CM | POA: Diagnosis not present

## 2014-07-12 DIAGNOSIS — I10 Essential (primary) hypertension: Secondary | ICD-10-CM | POA: Diagnosis not present

## 2014-07-12 LAB — POCT GLYCOSYLATED HEMOGLOBIN (HGB A1C): Hemoglobin A1C: 7.6

## 2014-07-12 MED ORDER — MONTELUKAST SODIUM 10 MG PO TABS: ORAL_TABLET | ORAL | Status: DC

## 2014-07-12 MED ORDER — GLYBURIDE 5 MG PO TABS: ORAL_TABLET | ORAL | Status: DC

## 2014-07-12 MED ORDER — METFORMIN HCL 1000 MG PO TABS: ORAL_TABLET | ORAL | Status: DC

## 2014-07-12 MED ORDER — SIMVASTATIN 20 MG PO TABS: ORAL_TABLET | ORAL | Status: DC

## 2014-07-12 MED ORDER — NITROGLYCERIN 0.4 MG SL SUBL: SUBLINGUAL_TABLET | SUBLINGUAL | Status: DC

## 2014-07-12 MED ORDER — METOPROLOL SUCCINATE ER 50 MG PO TB24: 50.0000 mg | ORAL_TABLET | Freq: Every day | ORAL | Status: DC

## 2014-07-12 MED ORDER — IPRATROPIUM BROMIDE 0.03 % NA SOLN: 2.0000 | Freq: Two times a day (BID) | NASAL | Status: DC

## 2014-07-12 NOTE — Progress Notes (Signed)
Subjective:    Patient ID: Joshua Ray, male    DOB: 01-Feb-1941, 74 y.o.   MRN: [id number]  HPI  This 74 y.o. Male is here for [facility] Subsequent annual exam. He has HTN and CAD, Type II DM w/ last A1c= 7.5%. Pt does not follow meal guide, pretty much eats whatever he wants. He is retired and spends most of his free time at [employer] w/ his dogs. He spends a lot of time at local hangouts and bars, drinking 3 + beers per sitting and eating fried foods. He has no plans to change his habits. He does not check FSBS. He denies hypoglycemic symptoms. Occasional BP check w/ readings 140-160/70-90.  HCM: PSA- Prostate Disorder followed by [facility].           CRS: 2010 (normal per pt w/ recall @ 10 years).           IMM: Current.           Vision- Annually.           Dental- Current.  Patient Active Problem List   Diagnosis Date Noted  . PVD (peripheral vascular disease) 10/13/2013  . TYPE II DIABETES MELLITUS WITHOUT MENTION OF COMPLICATION, NOT STATED AS UNCONTROLLED 04/13/2013  . Allergic rhinitis 12/03/2011  . HYPERCHOLESTEROLEMIA, PURE 11/03/2006  . DISORDER, TOBACCO USE 11/03/2006  . HYPERTENSION, ESSENTIAL NOS 11/03/2006  . CAD 11/03/2006    Prior to Admission medications   Medication Sig Start Date End Date Taking? Authorizing Provider  aspirin 325 MG tablet Take 325 mg by mouth daily.   Yes Historical Provider, MD  cholecalciferol (VITAMIN D) 1000 UNITS tablet Take 1,000 Units by mouth daily.   Yes Historical Provider, MD  glyBURIDE (DIABETA) 5 MG tablet TAKE 1 TABLET (5 MG TOTAL) BY MOUTH 2 (TWO) TIMES DAILY WITH A MEAL. PATIENT NEEDS OFFICE VISIT FOR ADDITIONAL REFILLS 06/05/14  Yes Barton Fanny, MD  glyBURIDE (DIABETA) 5 MG tablet TAKE 1 TABLET (5 MG TOTAL) BY MOUTH 2 (TWO) TIMES DAILY WITH A MEAL.   Yes Barton Fanny, MD  metFORMIN (GLUCOPHAGE) 1000 MG tablet TAKE 1 TABLET (1,000 MG TOTAL) BY MOUTH 2 (TWO) TIMES DAILY WITH MEAL   Yes Barton Fanny, MD    metoprolol succinate (TOPROL-XL) 50 MG 24 hr tablet Take 1 tablet (50 mg total) by mouth daily.   Yes Barton Fanny, MD  montelukast (SINGULAIR) 10 MG tablet TAKE 1 TABLET (10 MG TOTAL) BY MOUTH DAILY AT BEDTIME.   Yes Barton Fanny, MD  nitroGLYCERIN (NITROSTAT) 0.4 MG SL tablet PLACE 1 TABLET (0.4 MG TOTAL) UNDER THE TONGUE EVERY 5 (FIVE) MINUTES AS NEEDED.   Yes Barton Fanny, MD  simvastatin (ZOCOR) 20 MG tablet Take 1 tablet (20 mg total) by mouth daily at betime   Yes Barton Fanny, MD  vitamin B-12 (CYANOCOBALAMIN) 100 MCG tablet Take 400 mcg by mouth daily.   Yes Historical Provider, MD  vitamin E (VITAMIN E) 400 UNIT capsule Take 400 Units by mouth daily.   Yes Historical Provider, MD  ipratropium (ATROVENT) 0.03 % nasal spray Place 2 sprays into the nose 2 (two) times daily.    Barton Fanny, MD    Past Surgical History  Procedure Laterality Date  . Eye surgery      History   Social History  . Marital Status: Single    Spouse Name: N/A  . Number of Children: N/A  . Years of Education:  N/A   Occupational History  . Scientist, research (medical)- retired and works part-time    Social History Main Topics  . Smoking status: Current Every Day Smoker -- 0.50 packs/day for 30 years    Types: Cigarettes  . Smokeless tobacco: Not on file  . Alcohol Use: No  . Drug Use: No  . Sexual Activity: Not on file   Other Topics Concern  . Not on file   Social History Narrative   Exercise walking    Family History  Problem Relation Age of Onset  . Cancer Mother   . Heart disease Father     Review of Systems  Constitutional: Negative.   HENT: Positive for tinnitus.   Eyes: Negative.   Respiratory: Negative.   Cardiovascular: Negative.   Gastrointestinal: Negative.   Endocrine: Negative.   Genitourinary: Positive for frequency.  Musculoskeletal: Negative.   Skin: Negative.   Allergic/Immunologic: Negative.   Neurological: Negative.   Hematological: Negative.    Psychiatric/Behavioral: Negative.      Objective:   Physical Exam  Constitutional: He is oriented to person, place, and time. Vital signs are normal. He appears well-developed and well-nourished. No distress.  Blood pressure 177/76, pulse 82, temperature 97.9 F (36.6 C), temperature source Oral, resp. rate 16, height 5' 10.25" (1.784 m), weight 175 lb 3.2 oz (79.47 kg), SpO2 92 %.   HENT:  Head: Normocephalic and atraumatic.  Right Ear: Hearing, external ear and ear canal normal.  Left Ear: Hearing, tympanic membrane, external ear and ear canal normal.  Nose: Nose normal. No nasal deformity or septal deviation.  Mouth/Throat: Uvula is midline, oropharynx is clear and moist and mucous membranes are normal. No oral lesions.  Eyes: Conjunctivae, EOM and lids are normal. Pupils are equal, round, and reactive to light. No scleral icterus.  Wears corrrective lenses.  Neck: Trachea normal, normal range of motion, full passive range of motion without pain and phonation normal. Neck supple. No JVD present. No spinous process tenderness and no muscular tenderness present. Carotid bruit is not present. No thyroid mass and no thyromegaly present.  Cardiovascular: Normal rate, regular rhythm, S1 normal, S2 normal and normal heart sounds.   No extrasystoles are present. Exam reveals no gallop and no friction rub.   No murmur heard. Pulmonary/Chest: Effort normal and breath sounds normal. No respiratory distress. He has no wheezes.  Distant BS at bases.  Abdominal: Soft. Normal appearance and bowel sounds are normal. He exhibits no distension, no abdominal bruit, no pulsatile midline mass and no mass. There is no hepatosplenomegaly. There is no tenderness. There is no CVA tenderness.  Genitourinary:  Deferred- pt did not remove his pants or socks.  Musculoskeletal:       Cervical back: Normal.       Thoracic back: Normal.       Lumbar back: Normal.  Mild degenerative changes in hands/digits and  knees. Missing index finger on R hand.  Lymphadenopathy:       Head (right side): No submental, no submandibular, no tonsillar, no preauricular, no posterior auricular and no occipital adenopathy present.       Head (left side): No submental, no submandibular, no tonsillar, no preauricular, no posterior auricular and no occipital adenopathy present.    He has no cervical adenopathy.    He has no axillary adenopathy.       Right: No supraclavicular adenopathy present.       Left: No supraclavicular adenopathy present.  Neurological: He is alert and oriented to person,  place, and time. He has normal strength. He displays no atrophy and no tremor. No cranial nerve deficit or sensory deficit. He exhibits normal muscle tone. He displays a negative Romberg sign. Coordination and gait normal.  Reflex Scores:      Tricep reflexes are 1+ on the right side and 1+ on the left side.      Bicep reflexes are 1+ on the right side and 1+ on the left side.      Brachioradialis reflexes are 1+ on the right side and 1+ on the left side.      Patellar reflexes are 2+ on the right side and 2+ on the left side. GET UP and GO Test: time= 7-8 sec.  Skin: Skin is warm, dry and intact. No ecchymosis and no rash noted. He is not diaphoretic. No cyanosis or erythema. No pallor. Nails show no clubbing.  Psychiatric: He has a normal mood and affect. His behavior is normal. Judgment and thought content normal. Cognition and memory are normal.  Nursing note and vitals reviewed.   Results for orders placed or performed in visit on 07/12/14  POCT glycosylated hemoglobin (Hb A1C)  Result Value Ref Range   Hemoglobin A1C 7.6        Assessment & Plan:  Medicare annual wellness visit, subsequent  Diabetes mellitus without complication - Stable on current medications; pt not inclined to improve nutrition but I have asked him to make some small changes (i.e. Drink less beer and eat less fried and unhealthy foods!). Continue  current medications and continue to walk daily w/ dogs. Plan: HM Diabetes Foot Exam, POCT glycosylated hemoglobin (Hb A1C)  Essential hypertension- Continue current medications.  Meds ordered this encounter  Medications  . nitroGLYCERIN (NITROSTAT) 0.4 MG SL tablet    Sig: PLACE 1 TABLET (0.4 MG TOTAL) UNDER THE TONGUE EVERY 5 (FIVE) MINUTES AS NEEDED.    Dispense:  25 tablet    Refill:  1  . ipratropium (ATROVENT) 0.03 % nasal spray    Sig: Place 2 sprays into the nose 2 (two) times daily.    Dispense:  30 mL    Refill:  11  . metFORMIN (GLUCOPHAGE) 1000 MG tablet    Sig: TAKE 1 TABLET (1,000 MG TOTAL) BY MOUTH 2 (TWO) TIMES DAILY WITH MEAL    Dispense:  60 tablet    Refill:  11    No refills available  . montelukast (SINGULAIR) 10 MG tablet    Sig: TAKE 1 TABLET (10 MG TOTAL) BY MOUTH DAILY AT BEDTIME.    Dispense:  30 tablet    Refill:  11  . simvastatin (ZOCOR) 20 MG tablet    Sig: Take 1 tablet (20 mg total) by mouth daily at betime    Dispense:  30 tablet    Refill:  11    CYCLE FILL MEDICATION.  Marland Kitchen glyBURIDE (DIABETA) 5 MG tablet    Sig: TAKE 1 TABLET (5 MG TOTAL) BY MOUTH 2 (TWO) TIMES DAILY WITH A MEAL.    Dispense:  60 tablet    Refill:  11  . metoprolol succinate (TOPROL-XL) 50 MG 24 hr tablet    Sig: Take 1 tablet (50 mg total) by mouth daily.    Dispense:  30 tablet    Refill:  11    CYCLE FILL MEDICATION.     Gave pt information about ADVANCED DIRECTIVES and HEALTHCARE POWER of ATTORNEY. I advised that he discuss these legal forms w/ his attorney and get some  legal advise if he chooses not to discuss this w/ his family.

## 2014-07-12 NOTE — Patient Instructions (Signed)
You have a cyst on your right shoulder; you can be seen at the walk-in clinic without a prescription to have it removed by one of the PAs.   Keeping you healthy  Get these tests  Blood pressure- Have your blood pressure checked once a year by your healthcare provider.  Normal blood pressure is 120/80  Weight- Have your body mass index (BMI) calculated to screen for obesity.  BMI is a measure of body fat based on height and weight. You can also calculate your own BMI at ViewBanking.si.  Cholesterol- Have your cholesterol checked every year.  Diabetes- Have your blood sugar checked regularly if you have high blood pressure, high cholesterol, have a family history of diabetes or if you are overweight.  Screening for Colon Cancer- Colonoscopy starting at age 45.  Screening may begin sooner depending on your family history and other health conditions. Follow up colonoscopy as directed by your Gastroenterologist.  Screening for Prostate Cancer- Both blood work (PSA) and a rectal exam help screen for Prostate Cancer.  Screening begins at age 25 with African-American men and at age 78 with Caucasian men.  Screening may begin sooner depending on your family history.  Take these medicines  Aspirin- One aspirin daily can help prevent Heart disease and Stroke.  Flu shot- Every fall.  Tetanus- Every 10 years.  Zostavax- Once after the age of 64 to prevent Shingles.  Pneumonia shot- Once after the age of 35; if you are younger than 65, ask your healthcare provider if you need a Pneumonia shot.  Take these steps  Don't smoke- If you do smoke, talk to your doctor about quitting.  For tips on how to quit, go to www.smokefree.gov or call 1-800-QUIT-NOW.  Be physically active- Exercise 5 days a week for at least 30 minutes.  If you are not already physically active start slow and gradually work up to 30 minutes of moderate physical activity.  Examples of moderate activity include walking  briskly, mowing the yard, dancing, swimming, bicycling, etc.  Eat a healthy diet- Eat a variety of healthy food such as fruits, vegetables, low fat milk, low fat cheese, yogurt, lean meant, poultry, fish, beans, tofu, etc. For more information go to www.thenutritionsource.org  Drink alcohol in moderation- Limit alcohol intake to less than two drinks a day. Never drink and drive.  Dentist- Brush and floss twice daily; visit your dentist twice a year.  Depression- Your emotional health is as important as your physical health. If you're feeling down, or losing interest in things you would normally enjoy please talk to your healthcare provider.  Eye exam- Visit your eye doctor every year.  Safe sex- If you may be exposed to a sexually transmitted infection, use a condom.  Seat belts- Seat belts can save your life; always wear one.  Smoke/Carbon Monoxide detectors- These detectors need to be installed on the appropriate level of your home.  Replace batteries at least once a year.  Skin cancer- When out in the sun, cover up and use sunscreen 15 SPF or higher.  Violence- If anyone is threatening you, please tell your healthcare provider.  Living Will/ Health care power of attorney- Speak with your healthcare provider and family. Also consult your attorney.

## 2014-07-14 ENCOUNTER — Encounter: Payer: Self-pay | Admitting: Family Medicine

## 2014-07-19 DIAGNOSIS — C61 Malignant neoplasm of prostate: Secondary | ICD-10-CM | POA: Diagnosis not present

## 2014-07-19 DIAGNOSIS — R972 Elevated prostate specific antigen [PSA]: Secondary | ICD-10-CM | POA: Diagnosis not present

## 2014-08-15 DIAGNOSIS — E119 Type 2 diabetes mellitus without complications: Secondary | ICD-10-CM | POA: Diagnosis not present

## 2014-10-11 DIAGNOSIS — C61 Malignant neoplasm of prostate: Secondary | ICD-10-CM | POA: Diagnosis not present

## 2014-10-18 DIAGNOSIS — C61 Malignant neoplasm of prostate: Secondary | ICD-10-CM | POA: Diagnosis not present

## 2014-10-18 DIAGNOSIS — N5201 Erectile dysfunction due to arterial insufficiency: Secondary | ICD-10-CM | POA: Diagnosis not present

## 2014-11-01 ENCOUNTER — Ambulatory Visit: Payer: Medicare Other | Admitting: Family Medicine

## 2014-11-08 NOTE — Progress Notes (Deleted)
Prior pt of Dr. Janelle Floor here to transition care and f/u of chronic medical problems.  H/o prostate cancer in 2010.  Last seen 4 mos prior by Dr. Leward Quan for medicare AWV.  HTN:  Monitors BP outside of office occ - on toprol 50 qd  CAD with PVD: Takes asa 325 qd - on bb, statin. Not on acei due to angioedema on benazepril 06/2011.  EKG 10/2012, not seen cardiology  DMII.  Hgba1c 7.0 -> 7.5% ->7.6 ->   Not following any diet, drinking >3 beers daily and lots of fried foods - lots to go to the bar and no desire to try tlc but does walk his dogs daily, does not check cbgs at home.  Sees optho annually last 12/2013.  Takes asa, on glyburide 5 bidcc, metformin 1000 bid. No prior urine microalb in chart, pt had foot exam 06/2014.  Angioedema w/ acei 06/2011  HPL goal LDL <70 - simvastatin 20, last lipid panel 03/2013 with LDL 84, non-HDL of 101. Direct LDL 1 yr ago 15 so close to goal considering pt's age.  Needs CMP - willing to change to atrovastatin 20.  Tob abuse - pt is a candidate for low dose chest CT for lung cancer screening.  On otc vit D replacements as well as vit B12

## 2014-11-09 ENCOUNTER — Ambulatory Visit (INDEPENDENT_AMBULATORY_CARE_PROVIDER_SITE_OTHER): Payer: Medicare Other | Admitting: Family Medicine

## 2014-11-09 ENCOUNTER — Encounter: Payer: Self-pay | Admitting: Family Medicine

## 2014-11-09 VITALS — BP 190/81 | HR 82 | Temp 97.6°F | Resp 16 | Ht 71.0 in | Wt 169.0 lb

## 2014-11-09 DIAGNOSIS — Z720 Tobacco use: Secondary | ICD-10-CM | POA: Diagnosis not present

## 2014-11-09 DIAGNOSIS — R011 Cardiac murmur, unspecified: Secondary | ICD-10-CM

## 2014-11-09 DIAGNOSIS — I10 Essential (primary) hypertension: Secondary | ICD-10-CM

## 2014-11-09 DIAGNOSIS — R010 Benign and innocent cardiac murmurs: Secondary | ICD-10-CM

## 2014-11-09 DIAGNOSIS — E1169 Type 2 diabetes mellitus with other specified complication: Secondary | ICD-10-CM

## 2014-11-09 DIAGNOSIS — Z79899 Other long term (current) drug therapy: Secondary | ICD-10-CM

## 2014-11-09 DIAGNOSIS — I25810 Atherosclerosis of coronary artery bypass graft(s) without angina pectoris: Secondary | ICD-10-CM

## 2014-11-09 DIAGNOSIS — E7800 Pure hypercholesterolemia, unspecified: Secondary | ICD-10-CM

## 2014-11-09 DIAGNOSIS — I2510 Atherosclerotic heart disease of native coronary artery without angina pectoris: Secondary | ICD-10-CM | POA: Diagnosis not present

## 2014-11-09 DIAGNOSIS — F17200 Nicotine dependence, unspecified, uncomplicated: Secondary | ICD-10-CM

## 2014-11-09 LAB — MICROALBUMIN / CREATININE URINE RATIO
Creatinine, Urine: 156.9 mg/dL
Microalb Creat Ratio: 26.1 mg/g (ref 0.0–30.0)
Microalb, Ur: 4.1 mg/dL — ABNORMAL HIGH (ref <2.0)

## 2014-11-09 LAB — COMPREHENSIVE METABOLIC PANEL
ALT: 8 U/L (ref 0–53)
AST: 16 U/L (ref 0–37)
Albumin: 4.2 g/dL (ref 3.5–5.2)
Alkaline Phosphatase: 59 U/L (ref 39–117)
BUN: 11 mg/dL (ref 6–23)
CO2: 26 meq/L (ref 19–32)
Calcium: 8.9 mg/dL (ref 8.4–10.5)
Chloride: 102 mEq/L (ref 96–112)
Creat: 0.82 mg/dL (ref 0.50–1.35)
Glucose, Bld: 121 mg/dL — ABNORMAL HIGH (ref 70–99)
Potassium: 3.6 mEq/L (ref 3.5–5.3)
Sodium: 141 mEq/L (ref 135–145)
Total Bilirubin: 0.7 mg/dL (ref 0.2–1.2)
Total Protein: 6.5 g/dL (ref 6.0–8.3)

## 2014-11-09 LAB — LIPID PANEL
Cholesterol: 143 mg/dL (ref 0–200)
HDL: 43 mg/dL (ref >=40)
LDL Cholesterol: 80 mg/dL (ref 0–99)
TRIGLYCERIDES: 98 mg/dL (ref <150)
Total CHOL/HDL Ratio: 3.3 Ratio
VLDL: 20 mg/dL (ref 0–40)

## 2014-11-09 LAB — CBC
HCT: 44.8 % (ref 39.0–52.0)
Hemoglobin: 15.5 g/dL (ref 13.0–17.0)
MCH: 31.6 pg (ref 26.0–34.0)
MCHC: 34.6 g/dL (ref 30.0–36.0)
MCV: 91.2 fL (ref 78.0–100.0)
MPV: 9 fL (ref 8.6–12.4)
Platelets: 254 10*3/uL (ref 150–400)
RBC: 4.91 MIL/uL (ref 4.22–5.81)
RDW: 13.1 % (ref 11.5–15.5)
WBC: 7.6 10*3/uL (ref 4.0–10.5)

## 2014-11-09 LAB — POCT URINALYSIS DIPSTICK
Bilirubin, UA: NEGATIVE
Blood, UA: NEGATIVE
Glucose, UA: NEGATIVE
Ketones, UA: NEGATIVE
LEUKOCYTES UA: NEGATIVE
NITRITE UA: NEGATIVE
Spec Grav, UA: 1.02
UROBILINOGEN UA: 1
pH, UA: 7

## 2014-11-09 LAB — POCT GLYCOSYLATED HEMOGLOBIN (HGB A1C): HEMOGLOBIN A1C: 7

## 2014-11-09 NOTE — Progress Notes (Signed)
Subjective:  This chart was scribed for Delman Cheadle, MD by Moises Blood, Medical Scribe. This patient was seen in Room 23 and the patient's care was started 9:25 AM.    Patient ID: Joshua Ray, male    DOB: [birth year], 74 y.o.   MRN: [id number] Chief Complaint  Patient presents with  . Diabetes  . Hypertension    HPI Joshua Ray is a 74 y.o. male who presents to [facility] for follow up.  Prior pt of Dr. Janelle Floor here to transition care and f/u of chronic medical problems.  H/o prostate cancer in 2010.  Last seen 4 mos prior by Dr. Leward Quan for medicare AWV.  HTN:  Monitors BP outside of office occasionally - on toprol 50 qd; checks his blood pressure at home.   CAD with PVD: Takes asa 325 qd - on bb, statin. Not on acei due to angioedema on benazepril 06/2011.  EKG 10/2012, not seen cardiology since 1996. Has 4 stents back in 1996.   DMII.  Hgba1c 7.0 -> 7.5% ->7.6 -> 7.0 today.  Not following any diet, drinking >3 beers daily and lots of fried foods - lots to go to the bar and no desire to try tlc but does walk his dogs daily, does not check cbgs at home.  Sees optho annually last 12/2013.  Takes asa, on glyburide 5 bidcc, metformin 1000 bid. No prior urine microalb in chart, pt had foot exam 06/2014.  Angioedema w/ acei 06/2011.  He checks his blood sugar everyday when he wakes up. He reports blood sugar was at 109 this morning. During his last visit, he reports his blood sugar was 134. He notes it only gets really low when he doesn't eat well.   HPL goal LDL <70 - simvastatin 20, last lipid panel 03/2013 with LDL 84, non-HDL of 101. Direct LDL 1 yr ago 59 so close to goal considering pt's age.  Needs CMP  Tob abuse - pt is a candidate for low dose chest CT for lung cancer screening.  On otc vit D replacements as well as vit B12   His medications was last filled for a year and denies complications with medications. He denies eaten anything this morning.  He denies feet pain  and swelling. He also denies dizziness, headaches, light-headedness, and syncope.      Past Medical History  Diagnosis Date  . Diabetes mellitus   . Hyperlipidemia   . Hypertension   . CAD (coronary artery disease)     1996 stents and heart attack  . Prostate cancer   . Hemorrhoids   . Cataract   . Myocardial infarction    Current Outpatient Prescriptions on File Prior to Visit  Medication Sig Dispense Refill  . aspirin 325 MG tablet Take 325 mg by mouth daily.    . cholecalciferol (VITAMIN D) 1000 UNITS tablet Take 1,000 Units by mouth daily.    Marland Kitchen glyBURIDE (DIABETA) 5 MG tablet TAKE 1 TABLET (5 MG TOTAL) BY MOUTH 2 (TWO) TIMES DAILY WITH A MEAL. PATIENT NEEDS OFFICE VISIT FOR ADDITIONAL REFILLS 60 tablet 0  . glyBURIDE (DIABETA) 5 MG tablet TAKE 1 TABLET (5 MG TOTAL) BY MOUTH 2 (TWO) TIMES DAILY WITH A MEAL. 60 tablet 11  . ipratropium (ATROVENT) 0.03 % nasal spray Place 2 sprays into the nose 2 (two) times daily. 30 mL 11  . metFORMIN (GLUCOPHAGE) 1000 MG tablet TAKE 1 TABLET (1,000 MG TOTAL) BY MOUTH 2 (TWO) TIMES DAILY WITH MEAL 60  tablet 11  . metoprolol succinate (TOPROL-XL) 50 MG 24 hr tablet Take 1 tablet (50 mg total) by mouth daily. 30 tablet 11  . montelukast (SINGULAIR) 10 MG tablet TAKE 1 TABLET (10 MG TOTAL) BY MOUTH DAILY AT BEDTIME. 30 tablet 11  . nitroGLYCERIN (NITROSTAT) 0.4 MG SL tablet PLACE 1 TABLET (0.4 MG TOTAL) UNDER THE TONGUE EVERY 5 (FIVE) MINUTES AS NEEDED. 25 tablet 1  . simvastatin (ZOCOR) 20 MG tablet Take 1 tablet (20 mg total) by mouth daily at betime 30 tablet 11  . vitamin B-12 (CYANOCOBALAMIN) 100 MCG tablet Take 400 mcg by mouth daily.    . vitamin E (VITAMIN E) 400 UNIT capsule Take 400 Units by mouth daily.     No current facility-administered medications on file prior to visit.   Allergies  Allergen Reactions  . Ace Inhibitors   . Benazepril Swelling      Review of Systems  Constitutional: Negative for fever, chills, appetite change  and fatigue.  HENT: Negative for rhinorrhea and sneezing.   Eyes: Negative for visual disturbance.  Gastrointestinal: Negative for nausea, vomiting and diarrhea.  Neurological: Negative for dizziness, syncope, light-headedness and headaches.       Objective:   Physical Exam  Constitutional: He is oriented to person, place, and time. He appears well-developed and well-nourished. No distress.  HENT:  Head: Normocephalic and atraumatic.  Eyes: EOM are normal. Pupils are equal, round, and reactive to light.  Neck: Neck supple.  Cardiovascular: Normal rate.   Murmur heard.  Systolic murmur is present with a grade of 2/6  Pulmonary/Chest: Effort normal. No respiratory distress.  decr air movement with expiratory rhonchi  Musculoskeletal: Normal range of motion.  Neurological: He is alert and oriented to person, place, and time.  Skin: Skin is warm and dry.  Psychiatric: He has a normal mood and affect. His behavior is normal.  Nursing note and vitals reviewed.   BP 190/81 mmHg  Pulse 82  Temp(Src) 97.6 F (36.4 C) (Oral)  Resp 16  Ht 5\' 11"  (1.803 m)  Wt 169 lb (76.658 kg)  BMI 23.58 kg/m2  SpO2 96%       Assessment & Plan:  Refer to cardiologist:  1. Atherosclerosis of native coronary artery of native heart without angina pectoris   2. Essential hypertension   3. Type 2 diabetes mellitus with other specified complication   4. DISORDER, TOBACCO USE   5. HYPERCHOLESTEROLEMIA, PURE   6. Medication management   7. Coronary artery disease involving coronary bypass graft of native heart without angina pectoris   8. Undiagnosed cardiac murmurs     Orders Placed This Encounter  Procedures  . Lipid panel    Order Specific Question:  Has the patient fasted?    Answer:  Yes  . CBC  . Comprehensive metabolic panel    Order Specific Question:  Has the patient fasted?    Answer:  Yes  . Microalbumin/Creatinine Ratio, Urine  . Ambulatory referral to Cardiology    Referral  Priority:  Routine    Referral Type:  Consultation    Referral Reason:  Specialty Services Required    Requested Specialty:  Cardiology    Number of Visits Requested:  1  . POCT glycosylated hemoglobin (Hb A1C)  . POCT urinalysis dipstick  . HM Diabetes Foot Exam      I personally performed the services described in this documentation, which was scribed in my presence. The recorded information has been reviewed and considered, and  addended by me as needed.  Delman Cheadle, MD MPH  Results for orders placed or performed in visit on 11/09/14  Lipid panel  Result Value Ref Range   Cholesterol 143 0 - 200 mg/dL   Triglycerides 98 <150 mg/dL   HDL 43 >=40 mg/dL   Total CHOL/HDL Ratio 3.3 Ratio   VLDL 20 0 - 40 mg/dL   LDL Cholesterol 80 0 - 99 mg/dL  CBC  Result Value Ref Range   WBC 7.6 4.0 - 10.5 K/uL   RBC 4.91 4.22 - 5.81 MIL/uL   Hemoglobin 15.5 13.0 - 17.0 g/dL   HCT 44.8 39.0 - 52.0 %   MCV 91.2 78.0 - 100.0 fL   MCH 31.6 26.0 - 34.0 pg   MCHC 34.6 30.0 - 36.0 g/dL   RDW 13.1 11.5 - 15.5 %   Platelets 254 150 - 400 K/uL   MPV 9.0 8.6 - 12.4 fL  Comprehensive metabolic panel  Result Value Ref Range   Sodium 141 135 - 145 mEq/L   Potassium 3.6 3.5 - 5.3 mEq/L   Chloride 102 96 - 112 mEq/L   CO2 26 19 - 32 mEq/L   Glucose, Bld 121 (H) 70 - 99 mg/dL   BUN 11 6 - 23 mg/dL   Creat 0.82 0.50 - 1.35 mg/dL   Total Bilirubin 0.7 0.2 - 1.2 mg/dL   Alkaline Phosphatase 59 39 - 117 U/L   AST 16 0 - 37 U/L   ALT 8 0 - 53 U/L   Total Protein 6.5 6.0 - 8.3 g/dL   Albumin 4.2 3.5 - 5.2 g/dL   Calcium 8.9 8.4 - 10.5 mg/dL  Microalbumin/Creatinine Ratio, Urine  Result Value Ref Range   Microalb, Ur 4.1 (H) <2.0 mg/dL   Creatinine, Urine 156.9 mg/dL   Microalb Creat Ratio 26.1 0.0 - 30.0 mg/g  POCT glycosylated hemoglobin (Hb A1C)  Result Value Ref Range   Hemoglobin A1C 7.0   POCT urinalysis dipstick  Result Value Ref Range   Color, UA yellow    Clarity, UA clear    Glucose,  UA neg    Bilirubin, UA neg    Ketones, UA neg    Spec Grav, UA 1.020    Blood, UA neg    pH, UA 7.0    Protein, UA trace    Urobilinogen, UA 1.0    Nitrite, UA neg    Leukocytes, UA Negative Negative

## 2014-11-11 ENCOUNTER — Encounter: Payer: Self-pay | Admitting: Family Medicine

## 2014-11-12 DIAGNOSIS — E119 Type 2 diabetes mellitus without complications: Secondary | ICD-10-CM | POA: Diagnosis not present

## 2014-11-13 ENCOUNTER — Encounter: Payer: Self-pay | Admitting: Cardiovascular Disease

## 2015-01-10 ENCOUNTER — Ambulatory Visit: Payer: Self-pay | Admitting: Cardiovascular Disease

## 2015-01-16 ENCOUNTER — Ambulatory Visit: Payer: Self-pay | Admitting: Cardiovascular Disease

## 2015-01-16 ENCOUNTER — Telehealth: Payer: Self-pay | Admitting: Family Medicine

## 2015-01-16 DIAGNOSIS — C61 Malignant neoplasm of prostate: Secondary | ICD-10-CM | POA: Diagnosis not present

## 2015-01-16 NOTE — Telephone Encounter (Signed)
Ok to refill Nitrostat .4 mg. Please inform patient of importance of keeping appt in 04/2015.

## 2015-01-16 NOTE — Telephone Encounter (Signed)
Joshua Ray came in saying his Rx of Nitrostat 4x25 0.4MG  Tab expires in October. His next appt is scheduled in December. He would like a refill sent to his pharmacy at [facility]. Please call the pt if his Rx can't be sent to the pharmacy.  Pt ph# [phone] Thank you.

## 2015-01-16 NOTE — Telephone Encounter (Signed)
Ok to RF? 

## 2015-01-17 ENCOUNTER — Ambulatory Visit: Payer: Self-pay | Admitting: Cardiovascular Disease

## 2015-01-19 MED ORDER — NITROGLYCERIN 0.4 MG SL SUBL: SUBLINGUAL_TABLET | SUBLINGUAL | Status: AC

## 2015-01-19 NOTE — Telephone Encounter (Signed)
Rx sent 

## 2015-01-23 DIAGNOSIS — R972 Elevated prostate specific antigen [PSA]: Secondary | ICD-10-CM | POA: Diagnosis not present

## 2015-01-23 DIAGNOSIS — C61 Malignant neoplasm of prostate: Secondary | ICD-10-CM | POA: Diagnosis not present

## 2015-01-25 DIAGNOSIS — Z23 Encounter for immunization: Secondary | ICD-10-CM | POA: Diagnosis not present

## 2015-02-10 NOTE — Progress Notes (Signed)
Cardiology Office Note   Date:  02/11/2015   ID:  Joshua Ray, DOB [birth year], MRN [id number]  PCP:  Joshua Lennox, MD  Cardiologist:   Joshua Harness, MD   Chief Complaint  Patient presents with  . New Evaluation    pt states no Sx.      History of Present Illness: Joshua Ray is a 74 y.o. male with CAD s/p MI and PCI x3 in 1996 and PCI in 1997, hypertension, PVD, DM Type 2 (A1c 7.0%) and hyperlipidemia who presents to establish care.  Joshua Ray has been feeling well and is not sure why he is here for an appointment today.  He was evaluated by his PCP, Dr. Delman Ray on 11/09/14 and was noted to have poorly-controlled hypertension (BP 190/81).  A systolic murmur was noted at that appointment as well.  He states that he has been feeling well and denies any chest pain, shortness of breath, lower extremity edema, orthopnea, PND, lightheadedness, dizziness, or palpitations.  He occasionally checks his blood pressure at home and he thinks it is typically normal.  He states that his blood pressure is always high when he visits the doctor.  Joshua Ray likes to go out and party 2 nights per week. He has not been doing this recently because his favorite bar closed.  When he does go out he doesn't count how many alcoholic beverages he drinks. He knows that his bar tab is around $50. He continues to smoke half a pack of cigarettes daily. He does not want to quit.   Joshua Ray walks at the mall 2-3 times per week. He also dances for 3-4 hours when he goes out on the weekends.  Past Medical History  Diagnosis Date  . Diabetes mellitus   . Hyperlipidemia   . Hypertension   . CAD (coronary artery disease)     1996 stents and heart attack  . Prostate cancer   . Hemorrhoids   . Cataract   . Myocardial infarction   . Hyperlipidemia 02/11/2015    Past Surgical History  Procedure Laterality Date  . Eye surgery       Current Outpatient Prescriptions  Medication Sig  Dispense Refill  . aspirin 81 MG tablet Take 81 mg by mouth daily.    . cholecalciferol (VITAMIN D) 1000 UNITS tablet Take 1,000 Units by mouth daily.    Marland Kitchen glyBURIDE (DIABETA) 5 MG tablet TAKE 1 TABLET (5 MG TOTAL) BY MOUTH 2 (TWO) TIMES DAILY WITH A MEAL. 60 tablet 11  . ipratropium (ATROVENT) 0.03 % nasal spray Place 2 sprays into the nose 2 (two) times daily. 30 mL 11  . metFORMIN (GLUCOPHAGE) 1000 MG tablet TAKE 1 TABLET (1,000 MG TOTAL) BY MOUTH 2 (TWO) TIMES DAILY WITH MEAL 60 tablet 11  . metoprolol succinate (TOPROL-XL) 50 MG 24 hr tablet Take 1 tablet (50 mg total) by mouth daily. 30 tablet 11  . montelukast (SINGULAIR) 10 MG tablet TAKE 1 TABLET (10 MG TOTAL) BY MOUTH DAILY AT BEDTIME. 30 tablet 11  . nitroGLYCERIN (NITROSTAT) 0.4 MG SL tablet PLACE 1 TABLET (0.4 MG TOTAL) UNDER THE TONGUE EVERY 5 (FIVE) MINUTES AS NEEDED. 25 tablet 0  . simvastatin (ZOCOR) 20 MG tablet Take 1 tablet (20 mg total) by mouth daily at betime 30 tablet 11  . vitamin B-12 (CYANOCOBALAMIN) 100 MCG tablet Take 400 mcg by mouth daily.    . vitamin E (VITAMIN E) 400 UNIT capsule Take 400 Units by  mouth daily.     No current facility-administered medications for this visit.    Allergies:   Ace inhibitors and Benazepril    Social History:  The patient  reports that he has been smoking Cigarettes.  He has a 15 pack-year smoking history. He does not have any smokeless tobacco history on file. He reports that he does not drink alcohol or use illicit drugs.   Family History:  The patient's family history includes Cancer in his mother; Heart disease in his father.    ROS:  Please see the history of present illness.   Otherwise, review of systems are positive for none.   All other systems are reviewed and negative.    PHYSICAL EXAM: VS:  BP 190/90 mmHg  Pulse 78  Ht 5\' 10"  (1.778 m)  Wt 75.297 kg (166 lb)  BMI 23.82 kg/m2 , BMI Body mass index is 23.82 kg/(m^2). GENERAL:  Well appearing HEENT:  Pupils  equal round and reactive, fundi not visualized, oral mucosa unremarkable NECK:  No jugular venous distention, waveform within normal limits, carotid upstroke brisk and symmetric, no bruits, no thyromegaly LYMPHATICS:  No cervical adenopathy LUNGS:  Clear to auscultation bilaterally HEART:  RRR.  PMI not displaced or sustained,S1 and S2 within normal limits, no S3, no S4, no clicks, no rubs, I/IV early-peaking systoic murmur at RUSB ABD:  Flat, positive bowel sounds normal in frequency in pitch, no bruits, no rebound, no guarding, no midline pulsatile mass, no hepatomegaly, no splenomegaly EXT:  2 plus pulses throughout, no edema, no cyanosis no clubbing SKIN:  No rashes no nodules NEURO:  Cranial nerves II through XII grossly intact, motor grossly intact throughout PSYCH:  Cognitively intact, oriented to person place and time    EKG:  EKG is ordered today. The ekg ordered today demonstrates sinus rhythm at 78 bpm.  LVH with repolarization abnormality.  Possible prior inferior infarct.   Recent Labs: 11/09/2014: ALT 8; BUN 11; Creat 0.82; Hemoglobin 15.5; Platelets 254; Potassium 3.6; Sodium 141    Lipid Panel    Component Value Date/Time   CHOL 143 11/09/2014 0845   TRIG 98 11/09/2014 0845   HDL 43 11/09/2014 0845   CHOLHDL 3.3 11/09/2014 0845   VLDL 20 11/09/2014 0845   LDLCALC 80 11/09/2014 0845   LDLDIRECT 91 10/13/2013 0921      Wt Readings from Last 3 Encounters:  02/11/15 75.297 kg (166 lb)  11/09/14 76.658 kg (169 lb)  07/12/14 79.47 kg (175 lb 3.2 oz)      Other studies Reviewed: Additional studies/ records that were reviewed today include: Review of the above records demonstrates:  Please see elsewhere in the note.     ASSESSMENT AND PLAN:  # Hypertension: BP is high today and was also high with his primary care physician. Joshua Ray states that his blood pressure is normal at home, though he does not check it often. We have asked him to keep a BP log over the  next one to 2 weeks and to check his blood pressure at various times throughout the day. He will call us with these results. If his blood pressure is above goal, we will switch his metoprolol to carvedilol.  # CAD s/p MI and PCI: Joshua Ray has no current symptoms. However he has multiple poorly controlled risk factors. He is not interested in quitting smoking at this time. We are managing his blood pressure as above. Ideally he would be on atorvastatin or rosuvastatin. However his lipids  were at goal, and he is resistant to making changes. Therefore we will address this at another appointment. We will switch his aspirin 325-81 mg daily.   # Murmur: Mr. Schuh has a murmur that is consistent with mild aortic stenosis.  He is asymptomatic and prefers to wait on an echo until next year's appointment.  This is reasonable.  # AAA screening: He is a male smoker over 66, therefore qualifies for AAA screen.  WIll obtain next year when he gets his echo.  # Tobacco abuse: Patient not interested in quitting.   Current medicines are reviewed at length with the patient today.  The patient does not have concerns regarding medicines.  The following changes have been made:  Switch aspirin to 81mg  daily  Labs/ tests ordered today include:  No orders of the defined types were placed in this encounter.     Disposition:   FU with Dr. Jonelle Sidle C. Oval Linsey in 1 year.  Signed, Joshua Harness, MD  02/11/2015 8:43 AM    [facility]

## 2015-02-11 ENCOUNTER — Ambulatory Visit (INDEPENDENT_AMBULATORY_CARE_PROVIDER_SITE_OTHER): Payer: Medicare Other | Admitting: Cardiovascular Disease

## 2015-02-11 ENCOUNTER — Encounter: Payer: Self-pay | Admitting: Cardiovascular Disease

## 2015-02-11 VITALS — BP 190/90 | HR 78 | Ht 70.0 in | Wt 166.0 lb

## 2015-02-11 DIAGNOSIS — Z720 Tobacco use: Secondary | ICD-10-CM

## 2015-02-11 DIAGNOSIS — R011 Cardiac murmur, unspecified: Secondary | ICD-10-CM | POA: Diagnosis not present

## 2015-02-11 DIAGNOSIS — E785 Hyperlipidemia, unspecified: Secondary | ICD-10-CM | POA: Diagnosis not present

## 2015-02-11 DIAGNOSIS — I2510 Atherosclerotic heart disease of native coronary artery without angina pectoris: Secondary | ICD-10-CM | POA: Diagnosis not present

## 2015-02-11 DIAGNOSIS — E119 Type 2 diabetes mellitus without complications: Secondary | ICD-10-CM | POA: Diagnosis not present

## 2015-02-11 DIAGNOSIS — I10 Essential (primary) hypertension: Secondary | ICD-10-CM | POA: Diagnosis not present

## 2015-02-11 HISTORY — DX: Hyperlipidemia, unspecified: E78.5

## 2015-02-11 NOTE — Patient Instructions (Signed)
Dr Oval Linsey has recommended making the following medication changes: STOP full strength Aspirin (325 mg) START Aspirin 81 mg - take 1 tablet daily  Dr Oval Linsey recommends that you schedule a follow-up appointment in 1 year. You will receive a reminder letter in the mail two months in advance. If you don't receive a letter, please call our office to schedule the follow-up appointment.  **Please check your blood pressure daily for 1 week and keep a log on the sheet provided. Please call back to speak with a triage nurse about your readings.

## 2015-02-12 ENCOUNTER — Encounter: Payer: Self-pay | Admitting: Cardiovascular Disease

## 2015-04-01 ENCOUNTER — Telehealth: Payer: Self-pay | Admitting: Cardiovascular Disease

## 2015-04-01 ENCOUNTER — Telehealth: Payer: Self-pay | Admitting: *Deleted

## 2015-04-01 MED ORDER — CARVEDILOL 25 MG PO TABS: 25.0000 mg | ORAL_TABLET | Freq: Two times a day (BID) | ORAL | Status: DC

## 2015-04-01 NOTE — Telephone Encounter (Signed)
Joshua Latch, MD at 04/01/2015 10:31 AM     Status: Signed       Expand All Collapse All   Mr. Joshua Ray brought a log of his blood pressures that ranged from 167-193/79-101. We will stop metoprolol and start carvedilol 25 mg bid. He will also be asked to continue monitoring his blood pressure and return for a blood pressure assessment in 2-4 weeks.  Joshua C. Oval Linsey, MD 04/01/2015 10:33 AM        Spoke to patient, informed patient of change in medications  medication e-sent to pharmacy-[facility] DAY SUPPLY X 6 REFILLS APPOINTMENT SCHEDULE WITH PHARMACIST- KRISTIN Apr 29, 2015

## 2015-04-01 NOTE — Telephone Encounter (Signed)
Mr. Donaghue brought a log of his blood pressures that ranged from 167-193/79-101.  We will stop metoprolol and start carvedilol 25 mg bid.  He will also be asked to continue monitoring his blood pressure and return for a blood pressure assessment in 2-4 weeks.  Jhamal Plucinski C. Oval Linsey, MD 04/01/2015 10:33 AM

## 2015-04-09 ENCOUNTER — Telehealth: Payer: Self-pay | Admitting: Cardiovascular Disease

## 2015-04-09 NOTE — Telephone Encounter (Signed)
Mr. Joshua Ray is wanting to know why was he prescribed Carvedilol , will be at his number until 12:30pm . Please call .  Thanks

## 2015-04-09 NOTE — Telephone Encounter (Signed)
Pt prescribed carvedilol to replace metoprolol for BP control. Just picked up Rx today.  Pt read label warning, concerned about possible SE's of Carvedilol, listed potential problem of blurred vision, dizziness, etc.  Advised if concerned to start carvedilol at 12.5mg  BID for first few days to see if any reaction or problem before moving up to larger dose.  Pt aware I will forward to Dr Oval Linsey for further advice.  Pt aware to call to give update once started on med.

## 2015-04-22 DIAGNOSIS — C61 Malignant neoplasm of prostate: Secondary | ICD-10-CM | POA: Diagnosis not present

## 2015-04-29 ENCOUNTER — Ambulatory Visit: Payer: Medicare Other | Admitting: Pharmacist Clinician (PhC)/ Clinical Pharmacy Specialist

## 2015-04-29 DIAGNOSIS — N5201 Erectile dysfunction due to arterial insufficiency: Secondary | ICD-10-CM | POA: Diagnosis not present

## 2015-04-29 DIAGNOSIS — C61 Malignant neoplasm of prostate: Secondary | ICD-10-CM | POA: Diagnosis not present

## 2015-04-29 DIAGNOSIS — Q540 Hypospadias, balanic: Secondary | ICD-10-CM | POA: Diagnosis not present

## 2015-04-29 DIAGNOSIS — N433 Hydrocele, unspecified: Secondary | ICD-10-CM | POA: Diagnosis not present

## 2015-05-06 ENCOUNTER — Ambulatory Visit: Payer: Medicare Other | Admitting: Family Medicine

## 2015-05-08 ENCOUNTER — Ambulatory Visit (INDEPENDENT_AMBULATORY_CARE_PROVIDER_SITE_OTHER): Payer: Medicare Other | Admitting: Family Medicine

## 2015-05-08 ENCOUNTER — Encounter: Payer: Self-pay | Admitting: Family Medicine

## 2015-05-08 VITALS — BP 138/80 | HR 82 | Temp 97.9°F | Resp 16 | Ht 70.25 in | Wt 157.6 lb

## 2015-05-08 DIAGNOSIS — R05 Cough: Secondary | ICD-10-CM

## 2015-05-08 DIAGNOSIS — Z720 Tobacco use: Secondary | ICD-10-CM

## 2015-05-08 DIAGNOSIS — R059 Cough, unspecified: Secondary | ICD-10-CM

## 2015-05-08 DIAGNOSIS — I10 Essential (primary) hypertension: Secondary | ICD-10-CM

## 2015-05-08 DIAGNOSIS — E785 Hyperlipidemia, unspecified: Secondary | ICD-10-CM

## 2015-05-08 DIAGNOSIS — Z23 Encounter for immunization: Secondary | ICD-10-CM | POA: Diagnosis not present

## 2015-05-08 DIAGNOSIS — E119 Type 2 diabetes mellitus without complications: Secondary | ICD-10-CM

## 2015-05-08 DIAGNOSIS — R0982 Postnasal drip: Secondary | ICD-10-CM | POA: Diagnosis not present

## 2015-05-08 MED ORDER — IPRATROPIUM BROMIDE 0.03 % NA SOLN: 2.0000 | Freq: Two times a day (BID) | NASAL | Status: DC

## 2015-05-08 MED ORDER — METFORMIN HCL 1000 MG PO TABS: ORAL_TABLET | ORAL | Status: AC

## 2015-05-08 MED ORDER — SIMVASTATIN 20 MG PO TABS: ORAL_TABLET | ORAL | Status: DC

## 2015-05-08 MED ORDER — BENZONATATE 100 MG PO CAPS: 100.0000 mg | ORAL_CAPSULE | Freq: Three times a day (TID) | ORAL | Status: DC | PRN

## 2015-05-08 MED ORDER — METOPROLOL SUCCINATE ER 50 MG PO TB24: 50.0000 mg | ORAL_TABLET | Freq: Every day | ORAL | Status: DC

## 2015-05-08 MED ORDER — GLIPIZIDE 5 MG PO TABS: 2.5000 mg | ORAL_TABLET | Freq: Every day | ORAL | Status: DC

## 2015-05-08 NOTE — Patient Instructions (Addendum)
I will be in touch with your labs asap Your repeat blood pressure looked ok We changed your glyburide medication to glipizide- this is a similar medication for diabetes.  Start with a 1/2 tablet;  I refilled your metoprolol- you will take this and not the carvedilol You got your pneumonia vaccine today  Please see [place] in about 6 months Please think about quitting smoking  Try the tessalon perles for cough/

## 2015-05-08 NOTE — Progress Notes (Signed)
[facility] [address], [place] [address] [phone]  Date:  05/08/2015   Name:  Joshua Ray   DOB:  [birth year]   MRN:  [id number]  PCP:  Ellsworth Lennox, MD    Chief Complaint: Follow-up   History of Present Illness:  Joshua Ray is a 74 y.o. very pleasant male patient who presents with the following:  Here today to check on his DM- last visit about 6 months ago.  He is on metformin and glyburide.  However her has to change from glyburide to another sulfonylurea per his insurnace He is a smoker, has history of PVD  He saw cardiology for his HTN, tried coreg but this made him feel faint.  He is back on his metoprolol which he took in the past.   He is willing to have a prevnar shot today  He would like a RF of his atrovent nasal- he denies any sx of prostate enlargement of urinary retention He also notes a cough/ cold for a few days and would like something for this  Lab Results  Component Value Date   HGBA1C 7.0 11/09/2014     Patient Active Problem List   Diagnosis Date Noted  . Hyperlipidemia 02/11/2015  . Tobacco abuse 02/11/2015  . PVD (peripheral vascular disease) ([place]) 10/13/2013  . Type 2 diabetes mellitus ([place]) 04/13/2013  . Allergic rhinitis 12/03/2011  . HYPERCHOLESTEROLEMIA, PURE 11/03/2006  . DISORDER, TOBACCO USE 11/03/2006  . Essential hypertension 11/03/2006  . Coronary atherosclerosis 11/03/2006    Past Medical History  Diagnosis Date  . Diabetes mellitus   . Hyperlipidemia   . Hypertension   . CAD (coronary artery disease)     1996 stents and heart attack  . Prostate cancer ([place])   . Hemorrhoids   . Cataract   . Myocardial infarction ([place])   . Hyperlipidemia 02/11/2015    Past Surgical History  Procedure Laterality Date  . Eye surgery      Social History  Substance Use Topics  . Smoking status: Current Every Day Smoker -- 0.50 packs/day for 30 years    Types: Cigarettes  . Smokeless tobacco:  None  . Alcohol Use: Yes     Comment: beer    Family History  Problem Relation Age of Onset  . Cancer Mother   . Heart disease Father     Allergies  Allergen Reactions  . Ace Inhibitors   . Benazepril Swelling    Medication list has been reviewed and updated.  Current Outpatient Prescriptions on File Prior to Visit  Medication Sig Dispense Refill  . glyBURIDE (DIABETA) 5 MG tablet TAKE 1 TABLET (5 MG TOTAL) BY MOUTH 2 (TWO) TIMES DAILY WITH A MEAL. 60 tablet 11  . ipratropium (ATROVENT) 0.03 % nasal spray Place 2 sprays into the nose 2 (two) times daily. 30 mL 11  . metFORMIN (GLUCOPHAGE) 1000 MG tablet TAKE 1 TABLET (1,000 MG TOTAL) BY MOUTH 2 (TWO) TIMES DAILY WITH MEAL 60 tablet 11  . nitroGLYCERIN (NITROSTAT) 0.4 MG SL tablet PLACE 1 TABLET (0.4 MG TOTAL) UNDER THE TONGUE EVERY 5 (FIVE) MINUTES AS NEEDED. 25 tablet 0  . simvastatin (ZOCOR) 20 MG tablet Take 1 tablet (20 mg total) by mouth daily at betime 30 tablet 11  . vitamin B-12 (CYANOCOBALAMIN) 100 MCG tablet Take 400 mcg by mouth daily.    Marland Kitchen aspirin 81 MG tablet Take 81 mg by mouth daily. Reported on 05/08/2015    . carvedilol (  COREG) 25 MG tablet Take 1 tablet (25 mg total) by mouth 2 (two) times daily. (Patient not taking: Reported on 05/08/2015) 60 tablet 6  . cholecalciferol (VITAMIN D) 1000 UNITS tablet Take 1,000 Units by mouth daily.    . montelukast (SINGULAIR) 10 MG tablet TAKE 1 TABLET (10 MG TOTAL) BY MOUTH DAILY AT BEDTIME. (Patient not taking: Reported on 05/08/2015) 30 tablet 11  . vitamin E (VITAMIN E) 400 UNIT capsule Take 400 Units by mouth daily. Reported on 05/08/2015     No current facility-administered medications on file prior to visit.    Review of Systems:  As per HPI- otherwise negative.   Physical Examination: Filed Vitals:   05/08/15 1052 05/08/15 1057  BP: 195/84 171/78  Pulse: 82   Temp: 97.9 F (36.6 C)   Resp: 16    Filed Vitals:   05/08/15 1052  Height: 5' 10.25" (1.784 m)   Weight: 157 lb 9.6 oz (71.487 kg)   Body mass index is 22.46 kg/(m^2). Ideal Body Weight: Weight in (lb) to have BMI = 25: 175.1  GEN: WDWN, NAD, Non-toxic, A & O x 3, normal weight, tobacco odor HEENT: Atraumatic, Normocephalic. Neck supple. No masses, No LAD. Ears and Nose: No external deformity. CV: RRR, No M/G/R. No JVD. No thrill. No extra heart sounds. PULM: CTA B, no wheezes, crackles, rhonchi. No retractions. No resp. distress. No accessory muscle use. EXTR: No c/c/e NEURO Normal gait.  PSYCH: Normally interactive. Conversant. Not depressed or anxious appearing.  Calm demeanor.    Assessment and Plan: Controlled type 2 diabetes mellitus without complication, without long-term current use of insulin ([place]) - Plan: metFORMIN (GLUCOPHAGE) 1000 MG tablet, glipiZIDE (GLUCOTROL) 5 MG tablet, Hemoglobin [id number], Basic metabolic panel  Immunization due - Plan: Pneumococcal conjugate vaccine 13-valent IM  Essential hypertension - Plan: metoprolol succinate (TOPROL-XL) 50 MG 24 hr tablet  Tobacco abuse  Cough - Plan: benzonatate (TESSALON) 100 MG capsule  Hyperlipidemia - Plan: simvastatin (ZOCOR) 20 MG tablet  PND (post-nasal drip) - Plan: ipratropium (ATROVENT) 0.03 % nasal spray  Refilled medications as above Start on glipizde- lowered his dose, will check on A1c Encouraged smoking cessation  Signed Lamar Blinks, MD

## 2015-05-09 ENCOUNTER — Encounter: Payer: Self-pay | Admitting: Family Medicine

## 2015-05-09 LAB — BASIC METABOLIC PANEL
BUN: 15 mg/dL (ref 7–25)
CO2: 26 mmol/L (ref 20–31)
CREATININE: 0.9 mg/dL (ref 0.70–1.18)
Calcium: 8.8 mg/dL (ref 8.6–10.3)
Chloride: 99 mmol/L (ref 98–110)
Glucose, Bld: 239 mg/dL — ABNORMAL HIGH (ref 65–99)
POTASSIUM: 3.4 mmol/L — AB (ref 3.5–5.3)
Sodium: 137 mmol/L (ref 135–146)

## 2015-05-09 LAB — HEMOGLOBIN A1C
HEMOGLOBIN A1C: 6.9 % — AB (ref <5.7)
MEAN PLASMA GLUCOSE: 151 mg/dL — AB (ref <117)

## 2015-05-10 ENCOUNTER — Ambulatory Visit: Payer: Medicare Other | Admitting: Family Medicine

## 2015-05-13 DIAGNOSIS — E119 Type 2 diabetes mellitus without complications: Secondary | ICD-10-CM | POA: Diagnosis not present

## 2015-05-30 DIAGNOSIS — R972 Elevated prostate specific antigen [PSA]: Secondary | ICD-10-CM | POA: Insufficient documentation

## 2015-06-13 ENCOUNTER — Other Ambulatory Visit: Payer: Self-pay | Admitting: Family Medicine

## 2015-06-19 ENCOUNTER — Telehealth: Payer: Self-pay

## 2015-06-19 MED ORDER — GLIPIZIDE 5 MG PO TABS: 5.0000 mg | ORAL_TABLET | Freq: Every day | ORAL | Status: DC

## 2015-06-19 NOTE — Telephone Encounter (Signed)
Pharm faxed req for new glipizide Rx for 1 tab QD, with note that pt states he was instr'd to increase to 1 full tab if 1/2 tab was not effective. He has been taking the full tab and has run out of med. Dr Lorelei Pont, your AVS inst's from Dec OV do say "We changed your glyburide medication to glipizide- this is a similar medication for diabetes. Start with a 1/2 tablet;", but then you didn't write out to inc to a full tablet. I'm sure pt is correct, but need to verify that you did intend pt to inc to a full tab. Pended new Rx.

## 2015-07-19 ENCOUNTER — Telehealth: Payer: Self-pay | Admitting: Family Medicine

## 2015-07-19 NOTE — Telephone Encounter (Signed)
SPOKE WITH PATENT AND HE DID NOT HAVE AN EYE EXAM IN 2016.  HE WILL SCHEDULE ONE FOR THIS YEAR AND HAVE THEM FAX A COPY OF THE REPORT TO [place].

## 2015-07-29 ENCOUNTER — Ambulatory Visit: Payer: Medicare Other | Admitting: Family Medicine

## 2015-07-30 ENCOUNTER — Other Ambulatory Visit: Payer: Self-pay

## 2015-07-30 MED ORDER — MONTELUKAST SODIUM 10 MG PO TABS: ORAL_TABLET | ORAL | Status: AC

## 2015-07-31 ENCOUNTER — Ambulatory Visit: Payer: Medicare Other | Admitting: Family Medicine

## 2015-08-08 ENCOUNTER — Encounter (HOSPITAL_COMMUNITY): Payer: Self-pay

## 2015-08-08 ENCOUNTER — Emergency Department (HOSPITAL_COMMUNITY): Payer: Medicare Other

## 2015-08-08 ENCOUNTER — Observation Stay (HOSPITAL_COMMUNITY): Payer: Medicare Other

## 2015-08-08 ENCOUNTER — Observation Stay (HOSPITAL_COMMUNITY)
Admission: EM | Admit: 2015-08-08 | Discharge: 2015-08-10 | Disposition: A | Discharge status: Home / Self Care | Payer: Medicare Other | Attending: Internal Medicine | Admitting: Internal Medicine

## 2015-08-08 DIAGNOSIS — Y9389 Activity, other specified: Secondary | ICD-10-CM | POA: Diagnosis not present

## 2015-08-08 DIAGNOSIS — Z79899 Other long term (current) drug therapy: Secondary | ICD-10-CM | POA: Insufficient documentation

## 2015-08-08 DIAGNOSIS — R4182 Altered mental status, unspecified: Secondary | ICD-10-CM | POA: Diagnosis present

## 2015-08-08 DIAGNOSIS — E785 Hyperlipidemia, unspecified: Secondary | ICD-10-CM | POA: Diagnosis not present

## 2015-08-08 DIAGNOSIS — Z8546 Personal history of malignant neoplasm of prostate: Secondary | ICD-10-CM | POA: Insufficient documentation

## 2015-08-08 DIAGNOSIS — I252 Old myocardial infarction: Secondary | ICD-10-CM | POA: Diagnosis not present

## 2015-08-08 DIAGNOSIS — I674 Hypertensive encephalopathy: Secondary | ICD-10-CM | POA: Insufficient documentation

## 2015-08-08 DIAGNOSIS — F17210 Nicotine dependence, cigarettes, uncomplicated: Secondary | ICD-10-CM | POA: Insufficient documentation

## 2015-08-08 DIAGNOSIS — Z7982 Long term (current) use of aspirin: Secondary | ICD-10-CM | POA: Diagnosis not present

## 2015-08-08 DIAGNOSIS — E119 Type 2 diabetes mellitus without complications: Secondary | ICD-10-CM

## 2015-08-08 DIAGNOSIS — S0992XA Unspecified injury of nose, initial encounter: Secondary | ICD-10-CM | POA: Insufficient documentation

## 2015-08-08 DIAGNOSIS — S022XXA Fracture of nasal bones, initial encounter for closed fracture: Secondary | ICD-10-CM | POA: Diagnosis present

## 2015-08-08 DIAGNOSIS — R220 Localized swelling, mass and lump, head: Secondary | ICD-10-CM | POA: Diagnosis not present

## 2015-08-08 DIAGNOSIS — H269 Unspecified cataract: Secondary | ICD-10-CM | POA: Insufficient documentation

## 2015-08-08 DIAGNOSIS — E876 Hypokalemia: Secondary | ICD-10-CM | POA: Diagnosis present

## 2015-08-08 DIAGNOSIS — Z7984 Long term (current) use of oral hypoglycemic drugs: Secondary | ICD-10-CM | POA: Diagnosis not present

## 2015-08-08 DIAGNOSIS — I160 Hypertensive urgency: Secondary | ICD-10-CM | POA: Diagnosis present

## 2015-08-08 DIAGNOSIS — G9340 Encephalopathy, unspecified: Secondary | ICD-10-CM | POA: Diagnosis not present

## 2015-08-08 DIAGNOSIS — I2510 Atherosclerotic heart disease of native coronary artery without angina pectoris: Secondary | ICD-10-CM | POA: Diagnosis not present

## 2015-08-08 DIAGNOSIS — Y929 Unspecified place or not applicable: Secondary | ICD-10-CM | POA: Insufficient documentation

## 2015-08-08 DIAGNOSIS — Y998 Other external cause status: Secondary | ICD-10-CM | POA: Diagnosis not present

## 2015-08-08 DIAGNOSIS — I739 Peripheral vascular disease, unspecified: Secondary | ICD-10-CM | POA: Diagnosis present

## 2015-08-08 DIAGNOSIS — R55 Syncope and collapse: Secondary | ICD-10-CM | POA: Diagnosis present

## 2015-08-08 LAB — URINE MICROSCOPIC-ADD ON: WBC UA: NONE SEEN WBC/hpf (ref 0–5)

## 2015-08-08 LAB — URINALYSIS, ROUTINE W REFLEX MICROSCOPIC
BILIRUBIN URINE: NEGATIVE
Glucose, UA: 500 mg/dL — AB
KETONES UR: 15 mg/dL — AB
Leukocytes, UA: NEGATIVE
Nitrite: NEGATIVE
PH: 6 (ref 5.0–8.0)
Protein, ur: 100 mg/dL — AB
SPECIFIC GRAVITY, URINE: 1.016 (ref 1.005–1.030)

## 2015-08-08 LAB — COMPREHENSIVE METABOLIC PANEL
ALK PHOS: 67 U/L (ref 38–126)
ALT: 6 U/L — AB (ref 17–63)
ANION GAP: 19 — AB (ref 5–15)
AST: 19 U/L (ref 15–41)
Albumin: 4 g/dL (ref 3.5–5.0)
BILIRUBIN TOTAL: 0.9 mg/dL (ref 0.3–1.2)
BUN: 21 mg/dL — ABNORMAL HIGH (ref 6–20)
CALCIUM: 8.6 mg/dL — AB (ref 8.9–10.3)
CO2: 23 mmol/L (ref 22–32)
CREATININE: 1.08 mg/dL (ref 0.61–1.24)
Chloride: 96 mmol/L — ABNORMAL LOW (ref 101–111)
GFR calc non Af Amer: >60 mL/min (ref >60)
GLUCOSE: 206 mg/dL — AB (ref 65–99)
Potassium: 3 mmol/L — ABNORMAL LOW (ref 3.5–5.1)
Sodium: 138 mmol/L (ref 135–145)
TOTAL PROTEIN: 6.4 g/dL — AB (ref 6.5–8.1)

## 2015-08-08 LAB — ETHANOL

## 2015-08-08 LAB — TROPONIN I: TROPONIN I: 0.04 ng/mL — AB (ref <0.031)

## 2015-08-08 LAB — CBG MONITORING, ED: GLUCOSE-CAPILLARY: 163 mg/dL — AB (ref 65–99)

## 2015-08-08 LAB — RAPID URINE DRUG SCREEN, HOSP PERFORMED
Amphetamines: NOT DETECTED
BARBITURATES: NOT DETECTED
Benzodiazepines: NOT DETECTED
Cocaine: NOT DETECTED
OPIATES: NOT DETECTED
TETRAHYDROCANNABINOL: NOT DETECTED

## 2015-08-08 LAB — CBC
HCT: 46.9 % (ref 39.0–52.0)
HEMOGLOBIN: 14.5 g/dL (ref 13.0–17.0)
MCH: 31.4 pg (ref 26.0–34.0)
MCHC: 30.9 g/dL (ref 30.0–36.0)
MCV: 101.5 fL — ABNORMAL HIGH (ref 78.0–100.0)
PLATELETS: 181 10*3/uL (ref 150–400)
RBC: 4.62 MIL/uL (ref 4.22–5.81)
RDW: 13.2 % (ref 11.5–15.5)
WBC: 10 10*3/uL (ref 4.0–10.5)

## 2015-08-08 LAB — I-STAT TROPONIN, ED: Troponin i, poc: 0.03 ng/mL (ref 0.00–0.08)

## 2015-08-08 LAB — PROTIME-INR
INR: 0.99 (ref 0.00–1.49)
Prothrombin Time: 13.3 seconds (ref 11.6–15.2)

## 2015-08-08 LAB — APTT: aPTT: 27 seconds (ref 24–37)

## 2015-08-08 LAB — LIPASE, BLOOD: LIPASE: 32 U/L (ref 11–51)

## 2015-08-08 LAB — AMMONIA: Ammonia: 46 umol/L — ABNORMAL HIGH (ref 9–35)

## 2015-08-08 MED ORDER — OXYCODONE-ACETAMINOPHEN 5-325 MG PO TABS: 1.0000 | ORAL_TABLET | ORAL | Status: DC | PRN

## 2015-08-08 MED ORDER — AMLODIPINE BESYLATE 10 MG PO TABS
10.0000 mg | ORAL_TABLET | Freq: Every day | ORAL | Status: DC
  Administered 2015-08-09 – 2015-08-10 (×3): 10 mg via ORAL
  Filled 2015-08-08 (×3): qty 1

## 2015-08-08 MED ORDER — VITAMIN E 180 MG (400 UNIT) PO CAPS
400.0000 [IU] | ORAL_CAPSULE | Freq: Every day | ORAL | Status: DC
  Administered 2015-08-09 – 2015-08-10 (×2): 400 [IU] via ORAL
  Filled 2015-08-08 (×3): qty 1

## 2015-08-08 MED ORDER — BENZONATATE 100 MG PO CAPS: 100.0000 mg | ORAL_CAPSULE | Freq: Three times a day (TID) | ORAL | Status: DC | PRN

## 2015-08-08 MED ORDER — INSULIN ASPART 100 UNIT/ML ~~LOC~~ SOLN
0.0000 [IU] | Freq: Three times a day (TID) | SUBCUTANEOUS | Status: DC
Start: 2015-08-09 — End: 2015-08-10
  Administered 2015-08-09: 2 [IU] via SUBCUTANEOUS
  Administered 2015-08-09: 3 [IU] via SUBCUTANEOUS
  Administered 2015-08-10: 2 [IU] via SUBCUTANEOUS

## 2015-08-08 MED ORDER — INSULIN ASPART 100 UNIT/ML ~~LOC~~ SOLN: 0.0000 [IU] | Freq: Every day | SUBCUTANEOUS | Status: DC

## 2015-08-08 MED ORDER — HYDRALAZINE HCL 20 MG/ML IJ SOLN
5.0000 mg | INTRAMUSCULAR | Status: DC | PRN
  Administered 2015-08-09: 5 mg via INTRAVENOUS
  Filled 2015-08-08: qty 1

## 2015-08-08 MED ORDER — HYDRALAZINE HCL 20 MG/ML IJ SOLN
10.0000 mg | Freq: Once | INTRAMUSCULAR | Status: AC
  Administered 2015-08-09: 10 mg via INTRAVENOUS
  Filled 2015-08-08: qty 1

## 2015-08-08 MED ORDER — ZOLPIDEM TARTRATE 5 MG PO TABS: 5.0000 mg | ORAL_TABLET | Freq: Every evening | ORAL | Status: DC | PRN

## 2015-08-08 MED ORDER — ASPIRIN EC 81 MG PO TBEC
81.0000 mg | DELAYED_RELEASE_TABLET | Freq: Every day | ORAL | Status: DC
  Administered 2015-08-09 – 2015-08-10 (×2): 81 mg via ORAL
  Filled 2015-08-08 (×2): qty 1

## 2015-08-08 MED ORDER — ACETAMINOPHEN 325 MG PO TABS: 650.0000 mg | ORAL_TABLET | Freq: Four times a day (QID) | ORAL | Status: DC | PRN

## 2015-08-08 MED ORDER — ONDANSETRON HCL 4 MG/2ML IJ SOLN: 4.0000 mg | Freq: Four times a day (QID) | INTRAMUSCULAR | Status: DC | PRN

## 2015-08-08 MED ORDER — VITAMIN B-12 1000 MCG PO TABS
1000.0000 ug | ORAL_TABLET | Freq: Every day | ORAL | Status: DC
  Administered 2015-08-09 – 2015-08-10 (×2): 1000 ug via ORAL
  Filled 2015-08-08 (×2): qty 1

## 2015-08-08 MED ORDER — NICOTINE 21 MG/24HR TD PT24
21.0000 mg | MEDICATED_PATCH | Freq: Every day | TRANSDERMAL | Status: DC
  Administered 2015-08-09 – 2015-08-10 (×3): 21 mg via TRANSDERMAL
  Filled 2015-08-08 (×3): qty 1

## 2015-08-08 MED ORDER — ONDANSETRON HCL 4 MG PO TABS: 4.0000 mg | ORAL_TABLET | Freq: Four times a day (QID) | ORAL | Status: DC | PRN

## 2015-08-08 MED ORDER — SIMVASTATIN 20 MG PO TABS
20.0000 mg | ORAL_TABLET | Freq: Every day | ORAL | Status: DC
  Administered 2015-08-09: 20 mg via ORAL
  Filled 2015-08-08: qty 1

## 2015-08-08 MED ORDER — ALUM & MAG HYDROXIDE-SIMETH 200-200-20 MG/5ML PO SUSP: 30.0000 mL | Freq: Four times a day (QID) | ORAL | Status: DC | PRN

## 2015-08-08 MED ORDER — LABETALOL HCL 5 MG/ML IV SOLN
5.0000 mg | Freq: Once | INTRAVENOUS | Status: AC
  Administered 2015-08-08: 5 mg via INTRAVENOUS
  Filled 2015-08-08: qty 4

## 2015-08-08 MED ORDER — SODIUM CHLORIDE 0.9 % IV SOLN
INTRAVENOUS | Status: DC
  Administered 2015-08-09: 02:00:00 via INTRAVENOUS

## 2015-08-08 MED ORDER — IPRATROPIUM BROMIDE 0.06 % NA SOLN
2.0000 | Freq: Two times a day (BID) | NASAL | Status: DC
  Filled 2015-08-08: qty 30

## 2015-08-08 MED ORDER — ADULT MULTIVITAMIN W/MINERALS CH
1.0000 | ORAL_TABLET | Freq: Every day | ORAL | Status: DC
  Administered 2015-08-09 – 2015-08-10 (×2): 1 via ORAL
  Filled 2015-08-08 (×2): qty 1

## 2015-08-08 MED ORDER — MONTELUKAST SODIUM 10 MG PO TABS
10.0000 mg | ORAL_TABLET | Freq: Every day | ORAL | Status: DC
  Administered 2015-08-09 (×2): 10 mg via ORAL
  Filled 2015-08-08 (×3): qty 1

## 2015-08-08 MED ORDER — POTASSIUM CHLORIDE 20 MEQ/15ML (10%) PO SOLN
40.0000 meq | Freq: Once | ORAL | Status: DC
  Filled 2015-08-08: qty 30

## 2015-08-08 MED ORDER — METOPROLOL SUCCINATE ER 50 MG PO TB24
50.0000 mg | ORAL_TABLET | Freq: Every day | ORAL | Status: DC
  Administered 2015-08-09 – 2015-08-10 (×3): 50 mg via ORAL
  Filled 2015-08-08 (×3): qty 1

## 2015-08-08 MED ORDER — ACETAMINOPHEN 650 MG RE SUPP: 650.0000 mg | Freq: Four times a day (QID) | RECTAL | Status: DC | PRN

## 2015-08-08 MED ORDER — NITROGLYCERIN 0.4 MG SL SUBL: 0.4000 mg | SUBLINGUAL_TABLET | SUBLINGUAL | Status: DC | PRN

## 2015-08-08 MED ORDER — LABETALOL HCL 5 MG/ML IV SOLN: 5.0000 mg | Freq: Once | INTRAVENOUS | Status: DC

## 2015-08-08 MED ORDER — IOHEXOL 300 MG/ML  SOLN
100.0000 mL | Freq: Once | INTRAMUSCULAR | Status: AC | PRN
  Administered 2015-08-08: 100 mL via INTRAVENOUS

## 2015-08-08 MED ORDER — VITAMIN D 1000 UNITS PO TABS
1000.0000 [IU] | ORAL_TABLET | Freq: Every day | ORAL | Status: DC
  Administered 2015-08-09 – 2015-08-10 (×2): 1000 [IU] via ORAL
  Filled 2015-08-08 (×2): qty 1

## 2015-08-08 MED ORDER — SODIUM CHLORIDE 0.9% FLUSH
3.0000 mL | Freq: Two times a day (BID) | INTRAVENOUS | Status: DC
Start: 2015-08-08 — End: 2015-08-10
  Administered 2015-08-09 (×2): 3 mL via INTRAVENOUS
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
## 2015-08-08 NOTE — ED Notes (Addendum)
Pt very irritated about why he has been here so long and doesn't understand why. Dr.Campos and resident went it to talk to Pt and informed him that they will be keeping him overnight cause he's still a little confused and bp is high. Pt stated that he has 2 dogs at home that need to be cared for. I asked pt if there was anyone we could call for him to help and he said no. He said if he went home tonight or tomorrow that he didn't have a way to get there and that since we are keeping him nothing better happen to his dogs since they cant be fed. Pt stated that he is hungry and that we don't care about him. Pt given peanut butter crackers and jello cause he stated he doesn't eat [place]. Rn notified
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
## 2015-08-09 ENCOUNTER — Observation Stay (HOSPITAL_COMMUNITY): Payer: Medicare Other

## 2015-08-09 DIAGNOSIS — R55 Syncope and collapse: Principal | ICD-10-CM

## 2015-08-09 DIAGNOSIS — E876 Hypokalemia: Secondary | ICD-10-CM

## 2015-08-09 DIAGNOSIS — I160 Hypertensive urgency: Secondary | ICD-10-CM

## 2015-08-09 DIAGNOSIS — G9340 Encephalopathy, unspecified: Secondary | ICD-10-CM | POA: Diagnosis not present

## 2015-08-09 DIAGNOSIS — I2510 Atherosclerotic heart disease of native coronary artery without angina pectoris: Secondary | ICD-10-CM | POA: Diagnosis not present

## 2015-08-09 DIAGNOSIS — E1169 Type 2 diabetes mellitus with other specified complication: Secondary | ICD-10-CM

## 2015-08-09 LAB — CBC
HEMATOCRIT: 42.8 % (ref 39.0–52.0)
HEMOGLOBIN: 15 g/dL (ref 13.0–17.0)
MCH: 30.5 pg (ref 26.0–34.0)
MCHC: 35 g/dL (ref 30.0–36.0)
MCV: 87 fL (ref 78.0–100.0)
PLATELETS: 242 10*3/uL (ref 150–400)
RBC: 4.92 MIL/uL (ref 4.22–5.81)
RDW: 12.2 % (ref 11.5–15.5)
WBC: 10.1 10*3/uL (ref 4.0–10.5)

## 2015-08-09 LAB — COMPREHENSIVE METABOLIC PANEL
ALBUMIN: 3.9 g/dL (ref 3.5–5.0)
ALK PHOS: 59 U/L (ref 38–126)
ALT: 8 U/L — ABNORMAL LOW (ref 17–63)
AST: 16 U/L (ref 15–41)
Anion gap: 13 (ref 5–15)
BUN: 14 mg/dL (ref 6–20)
CHLORIDE: 96 mmol/L — AB (ref 101–111)
CO2: 26 mmol/L (ref 22–32)
CREATININE: 0.8 mg/dL (ref 0.61–1.24)
Calcium: 8.6 mg/dL — ABNORMAL LOW (ref 8.9–10.3)
GFR calc Af Amer: >60 mL/min (ref >60)
GLUCOSE: 165 mg/dL — AB (ref 65–99)
POTASSIUM: 2.6 mmol/L — AB (ref 3.5–5.1)
SODIUM: 135 mmol/L (ref 135–145)
Total Bilirubin: 1.2 mg/dL (ref 0.3–1.2)
Total Protein: 6.4 g/dL — ABNORMAL LOW (ref 6.5–8.1)

## 2015-08-09 LAB — TROPONIN I
TROPONIN I: 0.04 ng/mL — AB (ref <0.031)
TROPONIN I: 0.03 ng/mL (ref <0.031)

## 2015-08-09 LAB — GLUCOSE, CAPILLARY
GLUCOSE-CAPILLARY: 152 mg/dL — AB (ref 65–99)
GLUCOSE-CAPILLARY: 237 mg/dL — AB (ref 65–99)
GLUCOSE-CAPILLARY: 145 mg/dL — AB (ref 65–99)
GLUCOSE-CAPILLARY: 143 mg/dL — AB (ref 65–99)
Glucose-Capillary: 135 mg/dL — ABNORMAL HIGH (ref 65–99)
Glucose-Capillary: 173 mg/dL — ABNORMAL HIGH (ref 65–99)

## 2015-08-09 LAB — MRSA PCR SCREENING: MRSA BY PCR: NEGATIVE

## 2015-08-09 LAB — MAGNESIUM: MAGNESIUM: 0.8 mg/dL — AB (ref 1.7–2.4)

## 2015-08-09 MED ORDER — MAGNESIUM SULFATE 2 GM/50ML IV SOLN
2.0000 g | Freq: Once | INTRAVENOUS | Status: AC
  Administered 2015-08-09: 2 g via INTRAVENOUS
  Filled 2015-08-09: qty 50

## 2015-08-09 MED ORDER — HYDRALAZINE HCL 20 MG/ML IJ SOLN
5.0000 mg | Freq: Once | INTRAMUSCULAR | Status: AC
  Administered 2015-08-09: 5 mg via INTRAVENOUS
  Filled 2015-08-09: qty 1

## 2015-08-09 MED ORDER — POTASSIUM CHLORIDE CRYS ER 10 MEQ PO TBCR
40.0000 meq | EXTENDED_RELEASE_TABLET | Freq: Two times a day (BID) | ORAL | Status: DC
  Administered 2015-08-09: 40 meq via ORAL
  Filled 2015-08-09: qty 4

## 2015-08-09 MED ORDER — POTASSIUM CHLORIDE CRYS ER 20 MEQ PO TBCR: 40.0000 meq | EXTENDED_RELEASE_TABLET | Freq: Once | ORAL | Status: DC

## 2015-08-09 MED ORDER — POTASSIUM CHLORIDE CRYS ER 20 MEQ PO TBCR
60.0000 meq | EXTENDED_RELEASE_TABLET | Freq: Three times a day (TID) | ORAL | Status: AC
  Administered 2015-08-09 (×3): 60 meq via ORAL
  Filled 2015-08-09 (×3): qty 3
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
## 2015-08-09 NOTE — Progress Notes (Signed)
Patients SBP >180's and DBP >110's, patient appears asymptomatic, alert and oriented with some confusion. And forgetfulness. Paged the on call MD multiple times and finally got a response, with orders made. Will continue to monitor patient

## 2015-08-09 NOTE — Progress Notes (Signed)
EEG Completed; Results Pending  

## 2015-08-09 NOTE — Progress Notes (Signed)
Patients SBP  Still >180's and DBP was >100, L. HardukNP was notified with order to give once another dose of hydralazine 5mg . IV dose given will continue to monitor patient.
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
## 2015-08-10 ENCOUNTER — Observation Stay (HOSPITAL_COMMUNITY): Payer: Medicare Other

## 2015-08-10 DIAGNOSIS — R55 Syncope and collapse: Secondary | ICD-10-CM | POA: Diagnosis not present

## 2015-08-10 DIAGNOSIS — S022XXA Fracture of nasal bones, initial encounter for closed fracture: Secondary | ICD-10-CM

## 2015-08-10 DIAGNOSIS — E876 Hypokalemia: Secondary | ICD-10-CM | POA: Diagnosis not present

## 2015-08-10 DIAGNOSIS — I739 Peripheral vascular disease, unspecified: Secondary | ICD-10-CM

## 2015-08-10 DIAGNOSIS — G9340 Encephalopathy, unspecified: Secondary | ICD-10-CM | POA: Diagnosis not present

## 2015-08-10 DIAGNOSIS — I2510 Atherosclerotic heart disease of native coronary artery without angina pectoris: Secondary | ICD-10-CM | POA: Diagnosis not present

## 2015-08-10 LAB — HEMOGLOBIN A1C
HEMOGLOBIN A1C: 7 % — AB (ref 4.8–5.6)
Mean Plasma Glucose: 154 mg/dL

## 2015-08-10 LAB — BASIC METABOLIC PANEL
Anion gap: 14 (ref 5–15)
BUN: 21 mg/dL — ABNORMAL HIGH (ref 6–20)
CALCIUM: 8.9 mg/dL (ref 8.9–10.3)
CHLORIDE: 100 mmol/L — AB (ref 101–111)
CO2: 22 mmol/L (ref 22–32)
CREATININE: 0.91 mg/dL (ref 0.61–1.24)
Glucose, Bld: 147 mg/dL — ABNORMAL HIGH (ref 65–99)
Potassium: 4.6 mmol/L (ref 3.5–5.1)
SODIUM: 136 mmol/L (ref 135–145)

## 2015-08-10 LAB — GLUCOSE, CAPILLARY: GLUCOSE-CAPILLARY: 160 mg/dL — AB (ref 65–99)

## 2015-08-10 LAB — MAGNESIUM: MAGNESIUM: 1.9 mg/dL (ref 1.7–2.4)

## 2015-08-10 MED ORDER — AMLODIPINE BESYLATE 10 MG PO TABS: 10.0000 mg | ORAL_TABLET | Freq: Every day | ORAL | Status: DC

## 2015-08-10 NOTE — Discharge Instructions (Signed)
STROKE/TIA DISCHARGE INSTRUCTIONS SMOKING Cigarette smoking nearly doubles your risk of having a stroke & is the single most alterable risk factor  If you smoke or have smoked in the last 12 months, you are advised to quit smoking for your health.  Most of the excess cardiovascular risk related to smoking disappears within a year of stopping.  Ask you doctor about anti-smoking medications  [place] Quit Line: 1-800-QUIT NOW  Free Smoking Cessation Classes [phone]  CHOLESTEROL Know your levels; limit fat & cholesterol in your diet  Lipid Panel     Component Value Date/Time   CHOL 143 11/09/2014 0845   TRIG 98 11/09/2014 0845   HDL 43 11/09/2014 0845   CHOLHDL 3.3 11/09/2014 0845   VLDL 20 11/09/2014 0845   LDLCALC 80 11/09/2014 0845      Many patients benefit from treatment even if their cholesterol is at goal.  Goal: Total Cholesterol (CHOL) less than 160  Goal:  Triglycerides (TRIG) less than 150  Goal:  HDL greater than 40  Goal:  LDL (LDLCALC) less than 100   BLOOD PRESSURE American Stroke Association blood pressure target is less that 120/80 mm/Hg  Your discharge blood pressure is:  BP: (!) 145/78 mmHg  Monitor your blood pressure  Limit your salt and alcohol intake  Many individuals will require more than one medication for high blood pressure  DIABETES (A1c is a blood sugar average for last 3 months) Goal HGBA1c is under 7% (HBGA1c is blood sugar average for last 3 months)  Diabetes: Diagnosis of diabetes:  Your A1c:7 %    Lab Results  Component Value Date   HGBA1C 7.0* 08/09/2015     Your HGBA1c can be lowered with medications, healthy diet, and exercise.  Check your blood sugar as directed by your physician  Call your physician if you experience unexplained or low blood sugars.  PHYSICAL ACTIVITY/REHABILITATION Goal is 30 minutes at least 4 days per week  Activity: Increase activity slowly, Therapies: Physical Therapy: none Return to work: By discharge   Activity decreases your risk of heart attack and stroke and makes your heart stronger.  It helps control your weight and blood pressure; helps you relax and can improve your mood.  Participate in a regular exercise program.  Talk with your doctor about the best form of exercise for you (dancing, walking, swimming, cycling).  DIET/WEIGHT Goal is to maintain a healthy weight  Your discharge diet is: Diet Heart Room service appropriate?: Yes; Fluid consistency:: Thin  liquids Your height is:  Height: 5\' 10"  (177.8 cm) Your current weight is: Weight: 63.73 kg (140 lb 8 oz) Your Body Mass Index (BMI) is:  BMI (Calculated): 23  Following the type of diet specifically designed for you will help prevent another stroke.  Your goal weight range is:  149-183  Your goal Body Mass Index (BMI) is 19-24.  Healthy food habits can help reduce 3 risk factors for stroke:  High cholesterol, hypertension, and excess weight.  RESOURCES Stroke/Support Group:  Call [phone]   STROKE EDUCATION PROVIDED/REVIEWED AND GIVEN TO PATIENT Stroke warning signs and symptoms How to activate emergency medical system (call 911). Medications prescribed at discharge. Need for follow-up after discharge. Personal risk factors for stroke. Pneumonia vaccine given: No Flu vaccine given: No My questions have been answered, the writing is legible, and I understand these instructions.  I will adhere to these goals & educational materials that have been provided to me after my discharge from the hospital.
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
## 2015-08-21 DIAGNOSIS — R55 Syncope and collapse: Secondary | ICD-10-CM | POA: Insufficient documentation

## 2015-08-22 ENCOUNTER — Ambulatory Visit (INDEPENDENT_AMBULATORY_CARE_PROVIDER_SITE_OTHER): Payer: Medicare Other

## 2015-08-22 DIAGNOSIS — R55 Syncope and collapse: Secondary | ICD-10-CM | POA: Diagnosis not present

## 2015-08-28 ENCOUNTER — Other Ambulatory Visit: Payer: Self-pay | Admitting: Family Medicine

## 2015-09-02 ENCOUNTER — Other Ambulatory Visit (HOSPITAL_COMMUNITY): Payer: Self-pay | Admitting: Internal Medicine

## 2015-09-02 DIAGNOSIS — R55 Syncope and collapse: Secondary | ICD-10-CM

## 2015-09-04 ENCOUNTER — Ambulatory Visit: Payer: Medicare Other | Admitting: Cardiovascular Disease

## 2015-09-12 ENCOUNTER — Ambulatory Visit (HOSPITAL_COMMUNITY): Payer: Medicare Other

## 2015-09-23 ENCOUNTER — Emergency Department (HOSPITAL_COMMUNITY)
Admission: EM | Admit: 2015-09-23 | Discharge: 2015-09-23 | Disposition: A | Discharge status: Home / Self Care | Payer: Medicare Other | Attending: Emergency Medicine | Admitting: Emergency Medicine

## 2015-09-23 ENCOUNTER — Encounter (HOSPITAL_COMMUNITY): Payer: Self-pay | Admitting: Emergency Medicine

## 2015-09-23 ENCOUNTER — Ambulatory Visit (INDEPENDENT_AMBULATORY_CARE_PROVIDER_SITE_OTHER): Payer: Medicare Other | Admitting: Family Medicine

## 2015-09-23 ENCOUNTER — Encounter: Payer: Self-pay | Admitting: Family Medicine

## 2015-09-23 VITALS — BP 174/78 | HR 89 | Temp 97.7°F | Resp 18 | Ht 70.0 in | Wt 154.0 lb

## 2015-09-23 DIAGNOSIS — E785 Hyperlipidemia, unspecified: Secondary | ICD-10-CM | POA: Insufficient documentation

## 2015-09-23 DIAGNOSIS — T783XXA Angioneurotic edema, initial encounter: Secondary | ICD-10-CM | POA: Diagnosis present

## 2015-09-23 DIAGNOSIS — Z7952 Long term (current) use of systemic steroids: Secondary | ICD-10-CM | POA: Diagnosis not present

## 2015-09-23 DIAGNOSIS — I10 Essential (primary) hypertension: Secondary | ICD-10-CM | POA: Diagnosis not present

## 2015-09-23 DIAGNOSIS — Z7984 Long term (current) use of oral hypoglycemic drugs: Secondary | ICD-10-CM | POA: Insufficient documentation

## 2015-09-23 DIAGNOSIS — Z79899 Other long term (current) drug therapy: Secondary | ICD-10-CM | POA: Insufficient documentation

## 2015-09-23 DIAGNOSIS — F17210 Nicotine dependence, cigarettes, uncomplicated: Secondary | ICD-10-CM | POA: Diagnosis not present

## 2015-09-23 DIAGNOSIS — I2510 Atherosclerotic heart disease of native coronary artery without angina pectoris: Secondary | ICD-10-CM | POA: Insufficient documentation

## 2015-09-23 DIAGNOSIS — I252 Old myocardial infarction: Secondary | ICD-10-CM | POA: Diagnosis not present

## 2015-09-23 DIAGNOSIS — E119 Type 2 diabetes mellitus without complications: Secondary | ICD-10-CM | POA: Insufficient documentation

## 2015-09-23 DIAGNOSIS — Z7982 Long term (current) use of aspirin: Secondary | ICD-10-CM | POA: Insufficient documentation

## 2015-09-23 DIAGNOSIS — R220 Localized swelling, mass and lump, head: Secondary | ICD-10-CM | POA: Diagnosis not present

## 2015-09-23 LAB — GLUCOSE, POCT (MANUAL RESULT ENTRY): POC Glucose: 184 mg/dl — AB (ref 70–99)

## 2015-09-23 MED ORDER — GLIPIZIDE 10 MG PO TABS: 10.0000 mg | ORAL_TABLET | Freq: Every day | ORAL | Status: DC

## 2015-09-23 MED ORDER — CETIRIZINE HCL 10 MG PO TABS
10.0000 mg | ORAL_TABLET | Freq: Once | ORAL | Status: AC
  Administered 2015-09-23: 10 mg via ORAL

## 2015-09-23 MED ORDER — METHYLPREDNISOLONE SODIUM SUCC 125 MG IJ SOLR
125.0000 mg | Freq: Once | INTRAMUSCULAR | Status: AC
  Administered 2015-09-23: 125 mg via INTRAVENOUS

## 2015-09-23 MED ORDER — PREDNISONE 10 MG (21) PO TBPK: 10.0000 mg | ORAL_TABLET | Freq: Every day | ORAL | Status: DC

## 2015-09-23 MED ORDER — EPINEPHRINE 0.3 MG/0.3ML IJ SOAJ: 0.3000 mg | Freq: Once | INTRAMUSCULAR | Status: DC

## 2015-09-23 MED ORDER — RANITIDINE HCL 150 MG PO TABS
300.0000 mg | ORAL_TABLET | Freq: Once | ORAL | Status: AC
  Administered 2015-09-23: 300 mg via ORAL

## 2015-09-23 NOTE — ED Notes (Signed)
MD at bedside. 

## 2015-09-23 NOTE — ED Notes (Signed)
Bed: WA02 Expected date:  Expected time:  Means of arrival:  Comments: EMS- 75yo M, allergic reaction/med given

## 2015-09-23 NOTE — ED Notes (Signed)
Pt informed on the process about and would like to eat and drink now. Pt standing at the door waiting for the EDP.

## 2015-09-23 NOTE — ED Notes (Signed)
Ultrasound at bedside

## 2015-09-23 NOTE — Patient Instructions (Signed)
You have received a shot of cortisone here in the office that will raise your blood sugar for the next couple of days.  You're being sent to the emergency room so they can observe you to make sure that the tongue and throat do not swell.  You need to discuss with you doctor whether there is any possibility that the most recent medicine, the amlodipine, that you got one month ago could be the cause of this. At this point we do not know what caused the allergic reaction  It is important that you keep on hand a EpiPen which is an emergency shot you can give yourself in the event of this ever happening badly again.  Return as needed  Angioedema Angioedema is a sudden swelling of tissues, often of the skin. It can occur on the face or genitals or in the abdomen or other body parts. The swelling usually develops over a short period and gets better in 24 to 48 hours. It often begins during the night and is found when the person wakes up. The person may also get red, itchy patches of skin (hives). Angioedema can be dangerous if it involves swelling of the air passages.  Depending on the cause, episodes of angioedema may only happen once, come back in unpredictable patterns, or repeat for several years and then gradually fade away.  CAUSES  Angioedema can be caused by an allergic reaction to various triggers. It can also result from nonallergic causes, including reactions to drugs, immune system disorders, viral infections, or an abnormal gene that is passed to you from your parents (hereditary). For some people with angioedema, the cause is unknown.  Some things that can trigger angioedema include:   Foods.   Medicines, such as ACE inhibitors, ARBs, nonsteroidal anti-inflammatory agents, or estrogen.   Latex.   Animal saliva.   Insect stings.   Dyes used in X-rays.   Mild injury.   Dental work.  Surgery.  Stress.   Sudden changes in temperature.   Exercise. SIGNS AND SYMPTOMS    Swelling of the skin.  Hives. If these are present, there is also intense itching.  Redness in the affected area.   Pain in the affected area.  Swollen lips or tongue.  Breathing problems. This may happen if the air passages swell.  Wheezing. If internal organs are involved, there may be:   Nausea.   Abdominal pain.   Vomiting.   Difficulty swallowing.   Difficulty passing urine. DIAGNOSIS   Your health care provider will examine the affected area and take a medical and family history.  Various tests may be done to help determine the cause. Tests may include:  Allergy skin tests to see if the problem is an allergic reaction.   Blood tests to check for hereditary angioedema.   Tests to check for underlying diseases that could cause the condition.   A review of your medicines, including over-the-counter medicines, may be done. TREATMENT  Treatment will depend on the cause of the angioedema. Possible treatments include:   Removal of anything that triggered the condition (such as stopping certain medicines).   Medicines to treat symptoms or prevent attacks. Medicines given may include:   Antihistamines.   Epinephrine injection.   Steroids.   Hospitalization may be required for severe attacks. If the air passages are affected, it can be an emergency. Tubes may need to be placed to keep the airway open. HOME CARE INSTRUCTIONS   Take all medicines as directed by  your health care provider.  If you were given medicines for emergency allergy treatment, always carry them with you.  Wear a medical bracelet as directed by your health care provider.   Avoid known triggers. SEEK MEDICAL CARE IF:   You have repeat attacks of angioedema.   Your attacks are more frequent or more severe despite preventive measures.   You have hereditary angioedema and are considering having children. It is important to discuss with your health care provider the risks  of passing the condition on to your children. SEEK IMMEDIATE MEDICAL CARE IF:   You have severe swelling of the mouth, tongue, or lips.  You have difficulty breathing.   You have difficulty swallowing.   You faint. MAKE SURE YOU:  Understand these instructions.  Will watch your condition.  Will get help right away if you are not doing well or get worse.   This information is not intended to replace advice given to you by your health care provider. Make sure you discuss any questions you have with your health care provider.   Document Released: 07/20/2001 Document Revised: 06/01/2014 Document Reviewed: 01/02/2013 Elsevier Interactive Patient Education [employer].
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
## 2015-09-23 NOTE — ED Provider Notes (Signed)
CSN: [id number]     Arrival date & time 09/23/15  [id number] History   First MD Initiated Contact with Patient 09/23/15 [phone]     Chief Complaint  Patient presents with  . Angioedema   PT WOKE UP THIS AM WITH SWELLING OF HIS TONGUE.  THE PT INITIALLY WENT TO [facility] WITH THESE SX.  HE RECEIVED SOLUMEDROL, ZYRTEC, AND ZANTAC AT UC.  THE PT SAID THE SWELLING IS DOWN, BUT NOT GONE.  HE HAS NEVER HAD ANYTHING LIKE THIS IN THE PAST.  HE IS ANGRY THAT HE HAS TO BE HERE.  HE DENIES SOB.  (Consider location/radiation/quality/duration/timing/severity/associated sxs/prior Treatment) The history is provided by the patient and the EMS personnel.    Past Medical History  Diagnosis Date  . Diabetes mellitus   . Hyperlipidemia   . Hypertension   . CAD (coronary artery disease)     1996 stents and heart attack  . Prostate cancer ([place])   . Hemorrhoids   . Cataract   . Myocardial infarction ([place])   . Hyperlipidemia 02/11/2015   Past Surgical History  Procedure Laterality Date  . Eye surgery     Family History  Problem Relation Age of Onset  . Cancer Mother   . Heart disease Father    Social History  Substance Use Topics  . Smoking status: Current Every Day Smoker -- 0.50 packs/day for 30 years    Types: Cigarettes  . Smokeless tobacco: None  . Alcohol Use: Yes     Comment: beer    Review of Systems  HENT:       TONGUE SWELLING  All other systems reviewed and are negative.     Allergies  Ace inhibitors and Benazepril  Home Medications   Prior to Admission medications   Medication Sig Start Date End Date Taking? Authorizing Provider  amLODipine (NORVASC) 10 MG tablet Take 1 tablet (10 mg total) by mouth daily. 08/10/15  Yes Verlee Monte, MD  aspirin 81 MG tablet Take 81 mg by mouth daily. Reported on 05/08/2015   Yes Historical Provider, MD  cholecalciferol (VITAMIN D) 1000 UNITS tablet Take 1,000 Units by mouth daily.   Yes Historical Provider, MD  Cyanocobalamin (B-12 PO) Take 1  tablet by mouth daily.   Yes Historical Provider, MD  EPINEPHrine (EPIPEN 2-PAK) 0.3 mg/0.3 mL IJ SOAJ injection Inject 0.3 mLs (0.3 mg total) into the muscle once. 09/23/15  Yes Posey Boyer, MD  ipratropium (ATROVENT) 0.03 % nasal spray Place 2 sprays into the nose 2 (two) times daily. Patient taking differently: Place 2 sprays into the nose 2 (two) times daily as needed for rhinitis.  05/08/15 08/02/17 Yes Jessica C Copland, MD  metFORMIN (GLUCOPHAGE) 1000 MG tablet TAKE 1 TABLET (1,000 MG TOTAL) BY MOUTH 2 (TWO) TIMES DAILY WITH MEAL 05/08/15  Yes Gay Filler Copland, MD  metoprolol succinate (TOPROL-XL) 50 MG 24 hr tablet Take 1 tablet (50 mg total) by mouth daily. Take with or immediately following a meal. 05/08/15  Yes Jessica C Copland, MD  montelukast (SINGULAIR) 10 MG tablet TAKE 1 TABLET (10 MG TOTAL) BY MOUTH DAILY AT BEDTIME. Patient taking differently: TAKE 1 TABLET (10 MG TOTAL) BY MOUTH DAILY AT BEDTIME. AS NEEDED 07/30/15  Yes Jaynee Eagles, PA-C  nitroGLYCERIN (NITROSTAT) 0.4 MG SL tablet PLACE 1 TABLET (0.4 MG TOTAL) UNDER THE TONGUE EVERY 5 (FIVE) MINUTES AS NEEDED. 01/19/15  Yes Tishira R Brewington, PA-C  potassium chloride (K-DUR) 10 MEQ tablet Take 20 mEq by mouth  daily.  09/20/15  Yes Historical Provider, MD  simvastatin (ZOCOR) 20 MG tablet Take 1 tablet (20 mg total) by mouth daily at betime 05/08/15  Yes Jessica C Copland, MD  glipiZIDE (GLUCOTROL) 10 MG tablet Take 1 tablet (10 mg total) by mouth daily before breakfast. 09/23/15   Isla Pence, MD  predniSONE (STERAPRED UNI-PAK 21 TAB) 10 MG (21) TBPK tablet Take 1 tablet (10 mg total) by mouth daily. Take 6 tabs by mouth daily  for 2 days, then 5 tabs for 2 days, then 4 tabs for 2 days, then 3 tabs for 2 days, 2 tabs for 2 days, then 1 tab by mouth daily for 2 days 09/23/15   Isla Pence, MD   BP 186/86 mmHg  Pulse 94  Temp(Src) 97.8 F (36.6 C) (Oral)  Resp 18  SpO2 97% Physical Exam  Constitutional: He is oriented to  person, place, and time. He appears well-developed and well-nourished.  HENT:  Head: Normocephalic and atraumatic.  Right Ear: External ear normal.  Left Ear: External ear normal.  Nose: Nose normal.  Mouth/Throat:    Eyes: Conjunctivae and EOM are normal. Pupils are equal, round, and reactive to light.  Neck: Normal range of motion. Neck supple.  Cardiovascular: Normal rate, regular rhythm, normal heart sounds and intact distal pulses.   Pulmonary/Chest: Effort normal and breath sounds normal.  Abdominal: Soft. Bowel sounds are normal.  Musculoskeletal: Normal range of motion.  Neurological: He is alert and oriented to person, place, and time. He has normal reflexes.  Skin: Skin is warm and dry.  Psychiatric: He has a normal mood and affect. His behavior is normal. Thought content normal.  Nursing note and vitals reviewed.   ED Course  Procedures (including critical care time) Labs Review Labs Reviewed - No data to display  Imaging Review No results found. I have personally reviewed and evaluated these images and lab results as part of my medical decision-making.   EKG Interpretation None      MDM  PT WANTS TO GO HOME.  TONGUE SWELLING IS ALMOST BACK TO NL.  HE WANTS AN INCREASE IN HIS GLIPIZIDE DOSAGE.  HE SAID THAT HIS BLOOD SUGARS HAVE BEEN ELEVATED.  BECAUSE PT IS GOING ON STEROIDS, I WILL DO THAT.  HE KNOWS TO F/U WITH PCP FOR FURTHER DIABETES CARE.  PT KNOWS TO RETURN HERE IF SX WORSEN.  Final diagnoses:  Angioedema, initial encounter       Isla Pence, MD 09/23/15 [phone]

## 2015-09-23 NOTE — ED Notes (Signed)
Pt with [facility] from [place] with swelling of tongue this morning. Pt received 125 solumedrol, 10 zyrtec, and 300 zantac at UC. Pt alert and oriented speaking in clear sentences.  Pt ambulated to bathroom at triage. No difficulty.

## 2015-09-23 NOTE — ED Notes (Signed)
MD at bedside for assessment of patient.

## 2015-09-23 NOTE — ED Notes (Signed)
Pt going home in cab.

## 2015-09-23 NOTE — Discharge Instructions (Signed)
Angioedema  Angioedema is sudden puffiness (swelling), often of the skin. It can happen:  · On your face or privates (genitals).  · In your belly (abdomen) or other body parts.  It usually happens quickly and gets better in 1 or 2 days. It often starts at night and is found when you wake up. You may get red, itchy patches of skin (hives). Attacks can be dangerous if your breathing passages get puffy.  The condition may happen only once, or it can come back at random times. It may happen for several years before it goes away for good.  HOME CARE  · Only take medicines as told by your doctor.  · Always carry your emergency allergy medicines with you.  · Wear a medical bracelet as told by your doctor.  · Avoid things that you know will cause attacks (triggers).  GET HELP IF:  · You have another attack.  · Your attacks happen more often or get worse.  · The condition was passed to you by your parents and you want to have children.  GET HELP RIGHT AWAY IF:   · Your mouth, tongue, or lips are very puffy.  · You have trouble breathing.  · You have trouble swallowing.  · You pass out (faint).  MAKE SURE YOU:   · Understand these instructions.  · Will watch your condition.  · Will get help right away if you are not doing well or get worse.     This information is not intended to replace advice given to you by your health care provider. Make sure you discuss any questions you have with your health care provider.     Document Released: 04/29/2009 Document Revised: 03/01/2013 Document Reviewed: 01/02/2013  Elsevier Interactive Patient Education ©2016 [employer]

## 2015-09-23 NOTE — ED Notes (Signed)
Pt made aware to return if symptoms worsen or if any life threatening symptoms occur.   

## 2015-09-24 ENCOUNTER — Other Ambulatory Visit (HOSPITAL_COMMUNITY): Payer: Medicare Other

## 2015-10-07 ENCOUNTER — Emergency Department (HOSPITAL_COMMUNITY)
Admission: EM | Admit: 2015-10-07 | Discharge: 2015-10-07 | Disposition: A | Discharge status: Home / Self Care | Payer: Medicare Other | Attending: Emergency Medicine | Admitting: Emergency Medicine

## 2015-10-07 DIAGNOSIS — F17210 Nicotine dependence, cigarettes, uncomplicated: Secondary | ICD-10-CM | POA: Insufficient documentation

## 2015-10-07 DIAGNOSIS — I252 Old myocardial infarction: Secondary | ICD-10-CM | POA: Insufficient documentation

## 2015-10-07 DIAGNOSIS — I2510 Atherosclerotic heart disease of native coronary artery without angina pectoris: Secondary | ICD-10-CM | POA: Diagnosis not present

## 2015-10-07 DIAGNOSIS — Z7982 Long term (current) use of aspirin: Secondary | ICD-10-CM | POA: Diagnosis not present

## 2015-10-07 DIAGNOSIS — I10 Essential (primary) hypertension: Secondary | ICD-10-CM | POA: Diagnosis not present

## 2015-10-07 DIAGNOSIS — Z7984 Long term (current) use of oral hypoglycemic drugs: Secondary | ICD-10-CM | POA: Diagnosis not present

## 2015-10-07 DIAGNOSIS — E119 Type 2 diabetes mellitus without complications: Secondary | ICD-10-CM | POA: Insufficient documentation

## 2015-10-07 DIAGNOSIS — R220 Localized swelling, mass and lump, head: Secondary | ICD-10-CM | POA: Diagnosis present

## 2015-10-07 DIAGNOSIS — Z8546 Personal history of malignant neoplasm of prostate: Secondary | ICD-10-CM | POA: Insufficient documentation

## 2015-10-07 DIAGNOSIS — Z7952 Long term (current) use of systemic steroids: Secondary | ICD-10-CM | POA: Insufficient documentation

## 2015-10-07 DIAGNOSIS — E785 Hyperlipidemia, unspecified: Secondary | ICD-10-CM | POA: Diagnosis not present

## 2015-10-07 DIAGNOSIS — T783XXA Angioneurotic edema, initial encounter: Secondary | ICD-10-CM | POA: Insufficient documentation

## 2015-10-07 DIAGNOSIS — Z79899 Other long term (current) drug therapy: Secondary | ICD-10-CM | POA: Diagnosis not present

## 2015-10-07 MED ORDER — FAMOTIDINE IN NACL 20-0.9 MG/50ML-% IV SOLN
20.0000 mg | INTRAVENOUS | Status: AC
  Administered 2015-10-07: 20 mg via INTRAVENOUS
  Filled 2015-10-07: qty 50

## 2015-10-07 MED ORDER — METHYLPREDNISOLONE SODIUM SUCC 125 MG IJ SOLR
125.0000 mg | Freq: Once | INTRAMUSCULAR | Status: AC
  Administered 2015-10-07: 125 mg via INTRAVENOUS
  Filled 2015-10-07: qty 2

## 2015-10-07 MED ORDER — DIPHENHYDRAMINE HCL 50 MG/ML IJ SOLN
25.0000 mg | Freq: Once | INTRAMUSCULAR | Status: AC
  Administered 2015-10-07: 25 mg via INTRAVENOUS
  Filled 2015-10-07: qty 1

## 2015-10-07 MED ORDER — PREDNISONE 20 MG PO TABS: 40.0000 mg | ORAL_TABLET | Freq: Every day | ORAL | Status: DC

## 2015-10-07 NOTE — ED Provider Notes (Signed)
CSN: [id number]     Arrival date & time 10/07/15  0258 History  By signing my name below, I, [facility], attest that this documentation has been prepared under the direction and in the presence of Noemi Chapel, MD. Electronically Signed: Virgel Bouquet, ED Scribe. 10/07/2015. 3:36 AM.   Chief Complaint  Patient presents with  . Allergic Reaction  . Facial Swelling   The history is provided by the patient. No language interpreter was used.   HPI Comments: Joshua Ray is a 75 y.o. male who presents to the Emergency Department complaining of constant, moderate, gradually improving facial swelling around the right side of his jaw, lower lip, and right cheek onset earlier tonight. Pt states that he was asymptomatic when he went to sleep last night and woke from sleep earlier 2 hours ago with tongue and right-sided facial swelling. He notes similar symptoms 5 years ago when he took a medication ending in -pril and most recently 2 weeks ago for which he was evaluated in the [facility] ED by Isla Pence, PA-C. Pt reports that during this most recent episode symptoms gradually improved and resolved in ED without determining the cause of his swelling. Denies recent changes in medications, foods, soaps, lotions, detergents, chapstick. Denies pain, pruritis, difficulty swallowing, difficulty breathing, SOB, cough.  Past Medical History  Diagnosis Date  . Diabetes mellitus   . Hyperlipidemia   . Hypertension   . CAD (coronary artery disease)     1996 stents and heart attack  . Prostate cancer ([place])   . Hemorrhoids   . Cataract   . Myocardial infarction ([place])   . Hyperlipidemia 02/11/2015   Past Surgical History  Procedure Laterality Date  . Eye surgery     Family History  Problem Relation Age of Onset  . Cancer Mother   . Heart disease Father    Social History  Substance Use Topics  . Smoking status: Current Every Day Smoker -- 0.50 packs/day for 30 years    Types: Cigarettes   . Smokeless tobacco: Not on file  . Alcohol Use: Yes     Comment: beer    Review of Systems  All other systems reviewed and are negative.     Allergies  Ace inhibitors and Benazepril  Home Medications   Prior to Admission medications   Medication Sig Start Date End Date Taking? Authorizing Provider  amLODipine (NORVASC) 10 MG tablet Take 1 tablet (10 mg total) by mouth daily. 08/10/15  Yes Verlee Monte, MD  aspirin 81 MG tablet Take 81 mg by mouth daily. Reported on 05/08/2015   Yes Historical Provider, MD  cholecalciferol (VITAMIN D) 1000 UNITS tablet Take 1,000 Units by mouth daily.   Yes Historical Provider, MD  Cyanocobalamin (B-12 PO) Take 1 tablet by mouth daily.   Yes Historical Provider, MD  glipiZIDE (GLUCOTROL) 5 MG tablet Take 5 mg by mouth daily before breakfast.  10/04/15  Yes Historical Provider, MD  metFORMIN (GLUCOPHAGE) 1000 MG tablet TAKE 1 TABLET (1,000 MG TOTAL) BY MOUTH 2 (TWO) TIMES DAILY WITH MEAL 05/08/15  Yes Gay Filler Copland, MD  metoprolol succinate (TOPROL-XL) 50 MG 24 hr tablet Take 1 tablet (50 mg total) by mouth daily. Take with or immediately following a meal. 05/08/15  Yes Jessica C Copland, MD  potassium chloride (K-DUR) 10 MEQ tablet Take 20 mEq by mouth daily.  09/20/15  Yes Historical Provider, MD  simvastatin (ZOCOR) 20 MG tablet Take 1 tablet (20 mg total) by mouth daily at  betime 05/08/15  Yes Gay Filler Copland, MD  EPINEPHrine (EPIPEN 2-PAK) 0.3 mg/0.3 mL IJ SOAJ injection Inject 0.3 mLs (0.3 mg total) into the muscle once. Patient not taking: Reported on 10/07/2015 09/23/15   Posey Boyer, MD  glipiZIDE (GLUCOTROL) 10 MG tablet Take 1 tablet (10 mg total) by mouth daily before breakfast. Patient not taking: Reported on 10/07/2015 09/23/15   Isla Pence, MD  ipratropium (ATROVENT) 0.03 % nasal spray Place 2 sprays into the nose 2 (two) times daily. Patient taking differently: Place 2 sprays into the nose 2 (two) times daily as needed for  rhinitis.  05/08/15 08/02/17  Gay Filler Copland, MD  montelukast (SINGULAIR) 10 MG tablet TAKE 1 TABLET (10 MG TOTAL) BY MOUTH DAILY AT BEDTIME. Patient taking differently: TAKE 1 TABLET (10 MG TOTAL) BY MOUTH DAILY AT BEDTIME. AS NEEDED 07/30/15   Jaynee Eagles, PA-C  nitroGLYCERIN (NITROSTAT) 0.4 MG SL tablet PLACE 1 TABLET (0.4 MG TOTAL) UNDER THE TONGUE EVERY 5 (FIVE) MINUTES AS NEEDED. 01/19/15   Tishira R Brewington, PA-C  predniSONE (STERAPRED UNI-PAK 21 TAB) 10 MG (21) TBPK tablet Take 1 tablet (10 mg total) by mouth daily. Take 6 tabs by mouth daily  for 2 days, then 5 tabs for 2 days, then 4 tabs for 2 days, then 3 tabs for 2 days, 2 tabs for 2 days, then 1 tab by mouth daily for 2 days Patient not taking: Reported on 10/07/2015 09/23/15   Isla Pence, MD   BP 188/88 mmHg  Pulse 76  Temp(Src) 98.2 F (36.8 C) (Oral)  Resp 18  Ht 6' (1.829 m)  Wt 150 lb (68.04 kg)  BMI 20.34 kg/m2  SpO2 98% Physical Exam  Constitutional: He appears well-developed and well-nourished. No distress.  HENT:  Head: Normocephalic and atraumatic.  Mouth/Throat: Oropharynx is clear and moist. No oropharyngeal exudate.  Angioedema of lower lip extending up into the right cheek.  Eyes: Conjunctivae and EOM are normal. Pupils are equal, round, and reactive to light. Right eye exhibits no discharge. Left eye exhibits no discharge. No scleral icterus.  Neck: Normal range of motion. Neck supple. No JVD present. No thyromegaly present.  Cardiovascular: Normal rate, regular rhythm, normal heart sounds and intact distal pulses.  Exam reveals no gallop and no friction rub.   No murmur heard. Pulmonary/Chest: Effort normal and breath sounds normal. No respiratory distress. He has no wheezes. He has no rales.  Abdominal: Soft. Bowel sounds are normal. He exhibits no distension and no mass. There is no tenderness.  Musculoskeletal: Normal range of motion. He exhibits no edema or tenderness.  Lymphadenopathy:    He has no  cervical adenopathy.  Neurological: He is alert. Coordination normal.  Skin: Skin is warm and dry. No rash noted. No erythema.  Psychiatric: He has a normal mood and affect. His behavior is normal.  Nursing note and vitals reviewed.   ED Course  Procedures   DIAGNOSTIC STUDIES: Oxygen Saturation is 97% on RA, normal by my interpretation.    COORDINATION OF CARE: 3:26 AM Will order Benadryl, Pepcid. Discussed treatment plan with pt at bedside and pt agreed to plan.   Labs Review Labs Reviewed - No data to display  Imaging Review No results found. I have personally reviewed and evaluated these images and lab results as part of my medical decision-making.    MDM   Final diagnoses:  None    I personally performed the services described in this documentation, which was scribed in  my presence. The recorded information has been reviewed and is accurate.   D/w pharmacy - they agree likely norvasc- will have pt stop this immediately Can f/u with PCP for ongoing Htn care -  Pt has improved over last 3 hours and with meds Home with prednisone - pt instructed to stop taking norvasc.  Meds given in ED:  Medications  diphenhydrAMINE (BENADRYL) injection 25 mg (25 mg Intravenous Given 10/07/15 0351)  methylPREDNISolone sodium succinate (SOLU-MEDROL) 125 mg/2 mL injection 125 mg (125 mg Intravenous Given 10/07/15 0351)  famotidine (PEPCID) IVPB 20 mg premix (0 mg Intravenous Stopped 10/07/15 0435)    New Prescriptions   No medications on file       Noemi Chapel, MD 10/07/15 0700

## 2015-10-07 NOTE — ED Notes (Signed)
Pt signed out. E signature pad not recording.

## 2015-10-07 NOTE — ED Notes (Signed)
Patient complaining of his face is swollen on the right side. Patient states he went to bed at 10 pm and woke up about 01:30 with his face swollen. Patient states he doesn't know anything he is allergic to except ace inhibitor. Patient is not having any pain.

## 2015-10-26 ENCOUNTER — Emergency Department (HOSPITAL_COMMUNITY): Payer: Medicare Other

## 2015-10-26 ENCOUNTER — Encounter (HOSPITAL_COMMUNITY): Payer: Self-pay | Admitting: Emergency Medicine

## 2015-10-26 ENCOUNTER — Emergency Department (HOSPITAL_COMMUNITY)
Admission: EM | Admit: 2015-10-26 | Discharge: 2015-10-26 | Disposition: A | Discharge status: Home / Self Care | Payer: Medicare Other | Attending: Emergency Medicine | Admitting: Emergency Medicine

## 2015-10-26 DIAGNOSIS — E119 Type 2 diabetes mellitus without complications: Secondary | ICD-10-CM | POA: Insufficient documentation

## 2015-10-26 DIAGNOSIS — Z7982 Long term (current) use of aspirin: Secondary | ICD-10-CM | POA: Insufficient documentation

## 2015-10-26 DIAGNOSIS — Y999 Unspecified external cause status: Secondary | ICD-10-CM | POA: Insufficient documentation

## 2015-10-26 DIAGNOSIS — Y939 Activity, unspecified: Secondary | ICD-10-CM | POA: Diagnosis not present

## 2015-10-26 DIAGNOSIS — Z7984 Long term (current) use of oral hypoglycemic drugs: Secondary | ICD-10-CM | POA: Diagnosis not present

## 2015-10-26 DIAGNOSIS — E785 Hyperlipidemia, unspecified: Secondary | ICD-10-CM | POA: Diagnosis not present

## 2015-10-26 DIAGNOSIS — I252 Old myocardial infarction: Secondary | ICD-10-CM | POA: Insufficient documentation

## 2015-10-26 DIAGNOSIS — W01198A Fall on same level from slipping, tripping and stumbling with subsequent striking against other object, initial encounter: Secondary | ICD-10-CM | POA: Insufficient documentation

## 2015-10-26 DIAGNOSIS — Y9289 Other specified places as the place of occurrence of the external cause: Secondary | ICD-10-CM | POA: Diagnosis not present

## 2015-10-26 DIAGNOSIS — W19XXXA Unspecified fall, initial encounter: Secondary | ICD-10-CM

## 2015-10-26 DIAGNOSIS — I2510 Atherosclerotic heart disease of native coronary artery without angina pectoris: Secondary | ICD-10-CM | POA: Diagnosis not present

## 2015-10-26 DIAGNOSIS — F17210 Nicotine dependence, cigarettes, uncomplicated: Secondary | ICD-10-CM | POA: Diagnosis not present

## 2015-10-26 DIAGNOSIS — Z8546 Personal history of malignant neoplasm of prostate: Secondary | ICD-10-CM | POA: Insufficient documentation

## 2015-10-26 DIAGNOSIS — Z23 Encounter for immunization: Secondary | ICD-10-CM | POA: Diagnosis not present

## 2015-10-26 DIAGNOSIS — S0101XA Laceration without foreign body of scalp, initial encounter: Secondary | ICD-10-CM | POA: Diagnosis not present

## 2015-10-26 DIAGNOSIS — I10 Essential (primary) hypertension: Secondary | ICD-10-CM | POA: Insufficient documentation

## 2015-10-26 DIAGNOSIS — S0191XA Laceration without foreign body of unspecified part of head, initial encounter: Secondary | ICD-10-CM | POA: Diagnosis present

## 2015-10-26 MED ORDER — TETANUS-DIPHTH-ACELL PERTUSSIS 5-2.5-18.5 LF-MCG/0.5 IM SUSP
0.5000 mL | Freq: Once | INTRAMUSCULAR | Status: AC
  Administered 2015-10-26: 0.5 mL via INTRAMUSCULAR
  Filled 2015-10-26: qty 0.5

## 2015-10-26 MED ORDER — LIDOCAINE-EPINEPHRINE-TETRACAINE (LET) SOLUTION
3.0000 mL | Freq: Once | NASAL | Status: AC
  Administered 2015-10-26: 3 mL via TOPICAL
  Filled 2015-10-26: qty 3

## 2015-10-26 MED ORDER — LIDOCAINE-EPINEPHRINE (PF) 1 %-1:200000 IJ SOLN
20.0000 mL | Freq: Once | INTRAMUSCULAR | Status: AC
  Administered 2015-10-26: 20 mL via INTRADERMAL
  Filled 2015-10-26: qty 30

## 2015-10-26 NOTE — ED Notes (Signed)
Patient transported to CT 

## 2015-10-26 NOTE — ED Provider Notes (Signed)
CSN: [id number]     Arrival date & time 10/26/15  1758 History   First MD Initiated Contact with Patient 10/26/15 1912     Chief Complaint  Patient presents with  . Fall  . Head Laceration     (Consider location/radiation/quality/duration/timing/severity/associated sxs/prior Treatment) HPI   Patient is a 75 year old male with a history of CAD, HTN, diabetes, MI who presents the ED after a fall today around 2 PM. Patient states he was in his driveway when he fell backwards tripping over a cinder block and struck his head on the pavement. Patient states difficulty controlling the bleeding. He states constant pain around the area that is throbbing, nonradiating. He denies neck pain, headache, visual changes, nausea, vomiting, numbness/tingling, weakness, abdominal pain. Patient states he has not taken his evening medication.  Past Medical History  Diagnosis Date  . Diabetes mellitus   . Hyperlipidemia   . Hypertension   . CAD (coronary artery disease)     1996 stents and heart attack  . Prostate cancer ([place])   . Hemorrhoids   . Cataract   . Myocardial infarction ([place])   . Hyperlipidemia 02/11/2015   Past Surgical History  Procedure Laterality Date  . Eye surgery     Family History  Problem Relation Age of Onset  . Cancer Mother   . Heart disease Father    Social History  Substance Use Topics  . Smoking status: Current Every Day Smoker -- 0.50 packs/day for 30 years    Types: Cigarettes  . Smokeless tobacco: None  . Alcohol Use: Yes     Comment: beer    Review of Systems  Constitutional: Negative for fever.  Eyes: Negative for visual disturbance.  Respiratory: Negative for shortness of breath.   Cardiovascular: Negative for chest pain.  Gastrointestinal: Negative for nausea, vomiting and abdominal pain.  Musculoskeletal: Negative for back pain and neck pain.  Skin: Positive for wound.  Neurological: Negative for dizziness, syncope, speech difficulty, weakness,  light-headedness, numbness and headaches.  Psychiatric/Behavioral: Negative for confusion.      Allergies  Ace inhibitors and Benazepril  Home Medications   Prior to Admission medications   Medication Sig Start Date End Date Taking? Authorizing Provider  aspirin 81 MG tablet Take 81 mg by mouth daily. Reported on 05/08/2015   Yes Historical Provider, MD  cholecalciferol (VITAMIN D) 1000 UNITS tablet Take 1,000 Units by mouth daily.   Yes Historical Provider, MD  Cyanocobalamin (B-12 PO) Take 1 tablet by mouth daily.   Yes Historical Provider, MD  glipiZIDE (GLUCOTROL) 5 MG tablet Take 5 mg by mouth daily before breakfast.  10/04/15  Yes Historical Provider, MD  ipratropium (ATROVENT) 0.03 % nasal spray Place 2 sprays into the nose 2 (two) times daily. Patient taking differently: Place 2 sprays into the nose 2 (two) times daily as needed for rhinitis.  05/08/15 08/02/17 Yes Elliona Doddridge C Copland, MD  metFORMIN (GLUCOPHAGE) 1000 MG tablet TAKE 1 TABLET (1,000 MG TOTAL) BY MOUTH 2 (TWO) TIMES DAILY WITH MEAL 05/08/15  Yes Gay Filler Copland, MD  metoprolol succinate (TOPROL-XL) 50 MG 24 hr tablet Take 1 tablet (50 mg total) by mouth daily. Take with or immediately following a meal. 05/08/15  Yes Noelle Hoogland C Copland, MD  nitroGLYCERIN (NITROSTAT) 0.4 MG SL tablet PLACE 1 TABLET (0.4 MG TOTAL) UNDER THE TONGUE EVERY 5 (FIVE) MINUTES AS NEEDED. 01/19/15  Yes Tishira R Brewington, PA-C  potassium chloride (K-DUR) 10 MEQ tablet Take 20 mEq by mouth daily.  09/20/15  Yes Historical Provider, MD  simvastatin (ZOCOR) 20 MG tablet Take 1 tablet (20 mg total) by mouth daily at betime 05/08/15  Yes Stevey Stapleton C Copland, MD  EPINEPHrine (EPIPEN 2-PAK) 0.3 mg/0.3 mL IJ SOAJ injection Inject 0.3 mLs (0.3 mg total) into the muscle once. Patient not taking: Reported on 10/26/2015 09/23/15   Posey Boyer, MD  glipiZIDE (GLUCOTROL) 10 MG tablet Take 1 tablet (10 mg total) by mouth daily before breakfast. Patient not taking:  Reported on 10/07/2015 09/23/15   Isla Pence, MD  montelukast (SINGULAIR) 10 MG tablet TAKE 1 TABLET (10 MG TOTAL) BY MOUTH DAILY AT BEDTIME. Patient not taking: Reported on 10/26/2015 07/30/15   Jaynee Eagles, PA-C  predniSONE (DELTASONE) 20 MG tablet Take 2 tablets (40 mg total) by mouth daily. Patient not taking: Reported on 10/26/2015 10/07/15   Noemi Chapel, MD   BP 200/91 mmHg  Pulse 93  Temp(Src) 98.1 F (36.7 C) (Oral)  Resp 16  Ht 5\' 10"  (1.778 m)  Wt 68.04 kg  BMI 21.52 kg/m2  SpO2 97% Physical Exam  Constitutional: Pt is oriented to person, place, and time. Pt appears well-developed and well-nourished. No distress.  HENT:  Head: Normocephalic, roughly a 5 cm laceration noted to right side of temporal region.   Mouth/Throat: Oropharynx is clear and moist.  Eyes: Conjunctivae and EOM are normal. Pupils are equal, round, and reactive to light. No scleral icterus.  No horizontal, vertical or rotational nystagmus  Neck: Normal range of motion. Neck supple.  Full active and passive ROM without pain Cardiovascular: Normal rate, regular rhythm and intact distal pulses including radial and DP 2+ bilaterally.   Pulmonary/Chest: Effort normal  No respiratory distress, normal breath sounds, no wheezing, rhonchi, rales.  Abdominal: Soft. There is no tenderness. There is no rebound and no guarding.  Musculoskeletal: Normal range of motion.  Neurological: Pt. is alert and oriented to person, place, and time. No cranial nerve deficit.  Exhibits normal muscle tone. Coordination normal.  Mental Status:  Alert, oriented, thought content appropriate. Speech fluent without evidence of aphasia. Able to follow 2 step commands without difficulty.  Cranial Nerves:  II:  Peripheral visual fields grossly normal, pupils equal, round, reactive to light III,IV, VI: ptosis not present, extra-ocular motions intact bilaterally  V,VII: smile symmetric, facial light touch sensation equal VIII: hearing grossly  normal bilaterally  IX,X: midline uvula rise  XI: bilateral shoulder shrug equal and strong XII: midline tongue extension  Motor:  5/5 in upper and lower extremities bilaterally including strong and equal grip strength and dorsiflexion/plantar flexion Sensory: light touch normal in all extremities.  Cerebellar: normal finger-to-nose with bilateral upper extremities, pronator drift negative Gait: normal gait and balance Skin: Skin is warm and dry. No rash noted. Pt is not diaphoretic.  Psychiatric: Pt has a normal mood and affect. Behavior is normal. Judgment and thought content normal.  Nursing note and vitals reviewed.  ED Course  Procedures (including critical care time) Labs Review Labs Reviewed - No data to display  Imaging Review Ct Head Wo Contrast  10/26/2015  CLINICAL DATA:  75 year old male with acute head injury following fall today. Initial encounter. EXAM: CT HEAD WITHOUT CONTRAST TECHNIQUE: Contiguous axial images were obtained from the base of the skull through the vertex without intravenous contrast. COMPARISON:  08/08/2015 FINDINGS: Mild atrophy and chronic small-vessel white matter ischemic changes again noted. Remote basal ganglia and right splenic infarcts again identified. No acute intracranial abnormalities are identified, including mass lesion  or mass effect, hydrocephalus, extra-axial fluid collection, midline shift, hemorrhage, or acute infarction. The visualized bony calvarium is unremarkable. Posterior right scalp soft tissue swelling/ hematoma identified. IMPRESSION: No evidence of acute intracranial abnormality. Posterior right scalp soft tissue swelling without fracture. Atrophy, chronic small-vessel white matter ischemic changes and remote infarcts as described. Electronically Signed   By: Margarette Canada M.D.   On: 10/26/2015 20:46   I have personally reviewed and evaluated these images and lab results as part of my medical decision-making.   EKG  Interpretation None      MDM   Final diagnoses:  Scalp laceration, initial encounter  Fall, initial encounter   Patient with scalp laceration after a fall. No focal neurological deficits on exam. Patient takes aspirin daily. CT scan reviewed by me revealed no intercranial abnormalities including hemorrhage. Tdap booster given. Irrigation performed. Laceration occurred < 8 hours prior to repair which was well tolerated. Pt has no co morbidities to effect normal wound healing. Discussed suture home care w pt and answered questions. Pt to f-u for wound check and suture removal in 7-10 days. Patient blood pressure elevated in the ED. He states he normally takes medication around 4:00 or 5:00 and he has not done so yet today. I instructed the patient to take his medication when he gets home tonight. Pt is hemodynamically stable w no complaints prior to dc. Discussed strict return precautions to include signs of infection. Patient expressed understanding to the discharge instructions.     Kalman Drape, [place] 10/26/15 2228  Orlie Dakin, MD 10/27/15 [phone]

## 2015-10-26 NOTE — Discharge Instructions (Signed)
Follow-up with your primary care provider within 2 days to be reevaluated. Go to your primary care provider or return here to the ED to have your sutures removed within 7-10 days. Wait to shower until tomorrow. Keep the wound clean and dry.  Return to the Emergency department if you experience worsening pain, increased redness or warmth around the wound, foul discharge, fever, chills.  Laceration Care, Adult A laceration is a cut that goes through all of the layers of the skin and into the tissue that is right under the skin. Some lacerations heal on their own. Others need to be closed with stitches (sutures), staples, skin adhesive strips, or skin glue. Proper laceration care minimizes the risk of infection and helps the laceration to heal better. HOW TO CARE FOR YOUR LACERATION If sutures or staples were used:  Keep the wound clean and dry.  If you were given a bandage (dressing), you should change it at least one time per day or as told by your health care provider. You should also change it if it becomes wet or dirty.  Keep the wound completely dry for the first 24 hours or as told by your health care provider. After that time, you may shower or bathe. However, make sure that the wound is not soaked in water until after the sutures or staples have been removed.  Clean the wound one time each day or as told by your health care provider:  Wash the wound with soap and water.  Rinse the wound with water to remove all soap.  Pat the wound dry with a clean towel. Do not rub the wound.  After cleaning the wound, apply a thin layer of antibiotic ointmentas told by your health care provider. This will help to prevent infection and keep the dressing from sticking to the wound.  Have the sutures or staples removed as told by your health care provider. If skin adhesive strips were used:  Keep the wound clean and dry.  If you were given a bandage (dressing), you should change it at least one  time per day or as told by your health care provider. You should also change it if it becomes dirty or wet.  Do not get the skin adhesive strips wet. You may shower or bathe, but be careful to keep the wound dry.  If the wound gets wet, pat it dry with a clean towel. Do not rub the wound.  Skin adhesive strips fall off on their own. You may trim the strips as the wound heals. Do not remove skin adhesive strips that are still stuck to the wound. They will fall off in time. If skin glue was used:  Try to keep the wound dry, but you may briefly wet it in the shower or bath. Do not soak the wound in water, such as by swimming.  After you have showered or bathed, gently pat the wound dry with a clean towel. Do not rub the wound.  Do not do any activities that will make you sweat heavily until the skin glue has fallen off on its own.  Do not apply liquid, cream, or ointment medicine to the wound while the skin glue is in place. Using those may loosen the film before the wound has healed.  If you were given a bandage (dressing), you should change it at least one time per day or as told by your health care provider. You should also change it if it becomes dirty or wet.  If a dressing is placed over the wound, be careful not to apply tape directly over the skin glue. Doing that may cause the glue to be pulled off before the wound has healed.  Do not pick at the glue. The skin glue usually remains in place for 5-10 days, then it falls off of the skin. General Instructions  Take over-the-counter and prescription medicines only as told by your health care provider.  If you were prescribed an antibiotic medicine or ointment, take or apply it as told by your doctor. Do not stop using it even if your condition improves.  To help prevent scarring, make sure to cover your wound with sunscreen whenever you are outside after stitches are removed, after adhesive strips are removed, or when glue remains in  place and the wound is healed. Make sure to wear a sunscreen of at least 30 SPF.  Do not scratch or pick at the wound.  Keep all follow-up visits as told by your health care provider. This is important.  Check your wound every day for signs of infection. Watch for:  Redness, swelling, or pain.  Fluid, blood, or pus.  Raise (elevate) the injured area above the level of your heart while you are sitting or lying down, if possible. SEEK MEDICAL CARE IF:  You received a tetanus shot and you have swelling, severe pain, redness, or bleeding at the injection site.  You have a fever.  A wound that was closed breaks open.  You notice a bad smell coming from your wound or your dressing.  You notice something coming out of the wound, such as wood or glass.  Your pain is not controlled with medicine.  You have increased redness, swelling, or pain at the site of your wound.  You have fluid, blood, or pus coming from your wound.  You notice a change in the color of your skin near your wound.  You need to change the dressing frequently due to fluid, blood, or pus draining from the wound.  You develop a new rash.  You develop numbness around the wound. SEEK IMMEDIATE MEDICAL CARE IF:  You develop severe swelling around the wound.  Your pain suddenly increases and is severe.  You develop painful lumps near the wound or on skin that is anywhere on your body.  You have a red streak going away from your wound.  The wound is on your hand or foot and you cannot properly move a finger or toe.  The wound is on your hand or foot and you notice that your fingers or toes look pale or bluish.   This information is not intended to replace advice given to you by your health care provider. Make sure you discuss any questions you have with your health care provider.   Document Released: 05/11/2005 Document Revised: 09/25/2014 Document Reviewed: 05/07/2014 Elsevier Interactive Patient Education  [employer].

## 2015-10-26 NOTE — ED Notes (Addendum)
Pt from home with after tripping on a cinderblock in his driveway. Pt fell hitting his head. Pt has an actively bleeding head laceration that appears to be about 1 inch. Pt states it has been bleeding since 1400. Pt's head is now bandaged and bleeding is controlled. Pt states his only anticoagulant is aspirin 81mg . Pt denies LOC at time and his only other injury is a skin tear on his left elbow and he states his bottom hurts from the fall.  Pt denies dizziness or lightheadedness.

## 2015-10-26 NOTE — ED Notes (Signed)
Bed: WA09 Expected date:  Expected time:  Means of arrival:  Comments: TRI 9

## 2015-10-26 NOTE — ED Notes (Signed)
Patient d/c'd self care.  F/U reviewed.  Patient verbalized understanding. 

## 2015-10-26 NOTE — ED Provider Notes (Signed)
Patient tripped and fell over cinderblock 2 PM today striking his head on driveway. He laceration to scalp, abrasions to right shoulder and left elbow as result event. Denies loss of consciousness denies neck pain. No other associated symptoms. On exam alert Glasgow Coma Score 15 HEENT exam there is a hematoma at the vertex of the scalp neck supple. Trachea midline. No tenderness. Lungs clear auscultation heart regular rate and rhythm abdomen nondistended nontender. Left upper extremity there is an abrasion over the dorsal aspect of the elbow. Full range of motion neurovascular intact. Right upper extremity there is an abrasion overlying the scapula no tenderness. Full range of motion. Pelvis stable nontender. All bilateral lower extremities a contusion abrasion or tenderness neurovascular intact. Neurologic Glasgow Coma Score 15 gait normal motor strength 5 over 5 overall cranial nerves II through XII grossly intact LACERATION REPAIR Performed by: Orlie Dakin Authorized by: Orlie Dakin Consent: Verbal consent obtained. Risks and benefits: risks, benefits and alternatives were discussed Consent given by: patient Patient identity confirmed: provided demographic data Prepped and Draped in normal sterile fashion Wound explored  Laceration Location: scalp  Laceration Length: 5cm  No Foreign Bodies seen or palpated  Anesthesia: local infiltration  Local anesthetic: lidocaine 2% with epinephrine  Anesthetic total: 4 ml  Irrigation method: syringe Amount of cleaning: standard  Skin closure: Simple interrupted sutures   Number of sutures: 5  Technique: Simple interrupted   Patient tolerance: Patient tolerated the procedure well with no immediate complications.  Orlie Dakin, MD 10/26/15 2232

## 2015-10-26 NOTE — ED Notes (Signed)
PA at bedside.

## 2015-10-30 ENCOUNTER — Inpatient Hospital Stay (HOSPITAL_COMMUNITY)
Admission: EM | Admit: 2015-10-30 | Discharge: 2015-11-01 | DRG: 247 | Disposition: A | Discharge status: Home / Self Care | Payer: Medicare Other | Attending: Cardiovascular Disease | Admitting: Cardiovascular Disease

## 2015-10-30 ENCOUNTER — Emergency Department (HOSPITAL_COMMUNITY): Payer: Medicare Other

## 2015-10-30 ENCOUNTER — Encounter (HOSPITAL_COMMUNITY): Payer: Self-pay | Admitting: *Deleted

## 2015-10-30 DIAGNOSIS — I214 Non-ST elevation (NSTEMI) myocardial infarction: Secondary | ICD-10-CM | POA: Diagnosis present

## 2015-10-30 DIAGNOSIS — Z720 Tobacco use: Secondary | ICD-10-CM | POA: Diagnosis present

## 2015-10-30 DIAGNOSIS — Z9861 Coronary angioplasty status: Secondary | ICD-10-CM | POA: Diagnosis not present

## 2015-10-30 DIAGNOSIS — I10 Essential (primary) hypertension: Secondary | ICD-10-CM | POA: Diagnosis present

## 2015-10-30 DIAGNOSIS — S0191XA Laceration without foreign body of unspecified part of head, initial encounter: Secondary | ICD-10-CM | POA: Diagnosis present

## 2015-10-30 DIAGNOSIS — I2510 Atherosclerotic heart disease of native coronary artery without angina pectoris: Secondary | ICD-10-CM | POA: Insufficient documentation

## 2015-10-30 DIAGNOSIS — E785 Hyperlipidemia, unspecified: Secondary | ICD-10-CM | POA: Diagnosis present

## 2015-10-30 DIAGNOSIS — F17210 Nicotine dependence, cigarettes, uncomplicated: Secondary | ICD-10-CM | POA: Diagnosis present

## 2015-10-30 DIAGNOSIS — R4781 Slurred speech: Secondary | ICD-10-CM

## 2015-10-30 DIAGNOSIS — E876 Hypokalemia: Secondary | ICD-10-CM | POA: Diagnosis present

## 2015-10-30 DIAGNOSIS — I480 Paroxysmal atrial fibrillation: Secondary | ICD-10-CM | POA: Diagnosis present

## 2015-10-30 DIAGNOSIS — I252 Old myocardial infarction: Secondary | ICD-10-CM

## 2015-10-30 DIAGNOSIS — E1151 Type 2 diabetes mellitus with diabetic peripheral angiopathy without gangrene: Secondary | ICD-10-CM | POA: Diagnosis present

## 2015-10-30 DIAGNOSIS — I4891 Unspecified atrial fibrillation: Secondary | ICD-10-CM | POA: Diagnosis not present

## 2015-10-30 DIAGNOSIS — Z7982 Long term (current) use of aspirin: Secondary | ICD-10-CM

## 2015-10-30 DIAGNOSIS — Z955 Presence of coronary angioplasty implant and graft: Secondary | ICD-10-CM

## 2015-10-30 DIAGNOSIS — Z8673 Personal history of transient ischemic attack (TIA), and cerebral infarction without residual deficits: Secondary | ICD-10-CM

## 2015-10-30 DIAGNOSIS — R000 Tachycardia, unspecified: Secondary | ICD-10-CM | POA: Diagnosis present

## 2015-10-30 DIAGNOSIS — Z7984 Long term (current) use of oral hypoglycemic drugs: Secondary | ICD-10-CM

## 2015-10-30 DIAGNOSIS — I25118 Atherosclerotic heart disease of native coronary artery with other forms of angina pectoris: Secondary | ICD-10-CM | POA: Diagnosis not present

## 2015-10-30 DIAGNOSIS — E119 Type 2 diabetes mellitus without complications: Secondary | ICD-10-CM

## 2015-10-30 DIAGNOSIS — W19XXXA Unspecified fall, initial encounter: Secondary | ICD-10-CM | POA: Diagnosis present

## 2015-10-30 DIAGNOSIS — I25119 Atherosclerotic heart disease of native coronary artery with unspecified angina pectoris: Secondary | ICD-10-CM | POA: Diagnosis present

## 2015-10-30 DIAGNOSIS — E440 Moderate protein-calorie malnutrition: Secondary | ICD-10-CM | POA: Diagnosis present

## 2015-10-30 HISTORY — DX: Ventricular premature depolarization: I49.3

## 2015-10-30 HISTORY — DX: Tobacco use: Z72.0

## 2015-10-30 HISTORY — DX: Peripheral vascular disease, unspecified: I73.9

## 2015-10-30 HISTORY — DX: Cardiac murmur, unspecified: R01.1

## 2015-10-30 HISTORY — DX: Cerebral infarction, unspecified: I63.9

## 2015-10-30 LAB — BASIC METABOLIC PANEL
ANION GAP: 13 (ref 5–15)
BUN: 10 mg/dL (ref 6–20)
CO2: 23 mmol/L (ref 22–32)
CREATININE: 0.96 mg/dL (ref 0.61–1.24)
Calcium: 8.4 mg/dL — ABNORMAL LOW (ref 8.9–10.3)
Chloride: 101 mmol/L (ref 101–111)
GFR calc Af Amer: >60 mL/min (ref >60)
GFR calc non Af Amer: >60 mL/min (ref >60)
GLUCOSE: 217 mg/dL — AB (ref 65–99)
Potassium: 3.4 mmol/L — ABNORMAL LOW (ref 3.5–5.1)
Sodium: 137 mmol/L (ref 135–145)

## 2015-10-30 LAB — DIFFERENTIAL
Basophils Absolute: 0 K/uL (ref 0.0–0.1)
Basophils Relative: 0 %
Eosinophils Absolute: 0.1 K/uL (ref 0.0–0.7)
Eosinophils Relative: 1 %
Lymphocytes Relative: 13 %
Lymphs Abs: 1.3 K/uL (ref 0.7–4.0)
Monocytes Absolute: 0.7 K/uL (ref 0.1–1.0)
Monocytes Relative: 7 %
Neutro Abs: 8 K/uL — ABNORMAL HIGH (ref 1.7–7.7)
Neutrophils Relative %: 79 %

## 2015-10-30 LAB — HEPATIC FUNCTION PANEL
ALT: 7 U/L — ABNORMAL LOW (ref 17–63)
AST: 13 U/L — ABNORMAL LOW (ref 15–41)
Albumin: 3.2 g/dL — ABNORMAL LOW (ref 3.5–5.0)
Alkaline Phosphatase: 64 U/L (ref 38–126)
Bilirubin, Direct: 0.2 mg/dL (ref 0.1–0.5)
Indirect Bilirubin: 0.9 mg/dL (ref 0.3–0.9)
Total Bilirubin: 1.1 mg/dL (ref 0.3–1.2)
Total Protein: 6.2 g/dL — ABNORMAL LOW (ref 6.5–8.1)

## 2015-10-30 LAB — URINALYSIS, ROUTINE W REFLEX MICROSCOPIC
Bilirubin Urine: NEGATIVE
GLUCOSE, UA: 250 mg/dL — AB
HGB URINE DIPSTICK: NEGATIVE
Ketones, ur: 15 mg/dL — AB
Leukocytes, UA: NEGATIVE
Nitrite: NEGATIVE
Protein, ur: 30 mg/dL — AB
SPECIFIC GRAVITY, URINE: 1.014 (ref 1.005–1.030)
pH: 6.5 (ref 5.0–8.0)

## 2015-10-30 LAB — HEPARIN LEVEL (UNFRACTIONATED): Heparin Unfractionated: 0.22 IU/mL — ABNORMAL LOW (ref 0.30–0.70)

## 2015-10-30 LAB — CBC
HCT: 37.7 % — ABNORMAL LOW (ref 39.0–52.0)
Hemoglobin: 12.8 g/dL — ABNORMAL LOW (ref 13.0–17.0)
MCH: 30.3 pg (ref 26.0–34.0)
MCHC: 34 g/dL (ref 30.0–36.0)
MCV: 89.3 fL (ref 78.0–100.0)
Platelets: 296 K/uL (ref 150–400)
RBC: 4.22 MIL/uL (ref 4.22–5.81)
RDW: 13 % (ref 11.5–15.5)
WBC: 9.8 K/uL (ref 4.0–10.5)

## 2015-10-30 LAB — GLUCOSE, CAPILLARY
GLUCOSE-CAPILLARY: 193 mg/dL — AB (ref 65–99)
Glucose-Capillary: 241 mg/dL — ABNORMAL HIGH (ref 65–99)

## 2015-10-30 LAB — TROPONIN I
TROPONIN I: 1.77 ng/mL — AB (ref <0.031)
Troponin I: 0.07 ng/mL — ABNORMAL HIGH
Troponin I: 1.21 ng/mL (ref <0.031)

## 2015-10-30 LAB — PROTIME-INR
INR: 1.03 (ref 0.00–1.49)
Prothrombin Time: 13.7 s (ref 11.6–15.2)

## 2015-10-30 LAB — URINE MICROSCOPIC-ADD ON

## 2015-10-30 LAB — MAGNESIUM
MAGNESIUM: 1.7 mg/dL (ref 1.7–2.4)
Magnesium: 0.9 mg/dL — CL (ref 1.7–2.4)

## 2015-10-30 LAB — T4, FREE: Free T4: 1.21 ng/dL — ABNORMAL HIGH (ref 0.61–1.12)

## 2015-10-30 LAB — MRSA PCR SCREENING: MRSA BY PCR: NEGATIVE

## 2015-10-30 LAB — BRAIN NATRIURETIC PEPTIDE: B Natriuretic Peptide: 144.2 pg/mL — ABNORMAL HIGH (ref 0.0–100.0)

## 2015-10-30 LAB — TSH: TSH: 1.414 u[IU]/mL (ref 0.350–4.500)

## 2015-10-30 MED ORDER — METOPROLOL TARTRATE 25 MG PO TABS
50.0000 mg | ORAL_TABLET | Freq: Once | ORAL | Status: AC
  Administered 2015-10-30: 50 mg via ORAL
  Filled 2015-10-30: qty 2

## 2015-10-30 MED ORDER — SODIUM CHLORIDE 0.9 % WEIGHT BASED INFUSION
1.0000 mL/kg/h | INTRAVENOUS | Status: DC
  Administered 2015-10-31: 1 mL/kg/h via INTRAVENOUS

## 2015-10-30 MED ORDER — ASPIRIN 81 MG PO CHEW
81.0000 mg | CHEWABLE_TABLET | ORAL | Status: AC
  Administered 2015-10-31: 81 mg via ORAL
  Filled 2015-10-30: qty 1

## 2015-10-30 MED ORDER — ACETAMINOPHEN 325 MG PO TABS: 650.0000 mg | ORAL_TABLET | ORAL | Status: DC | PRN

## 2015-10-30 MED ORDER — POTASSIUM CHLORIDE CRYS ER 10 MEQ PO TBCR
20.0000 meq | EXTENDED_RELEASE_TABLET | Freq: Every day | ORAL | Status: DC
  Administered 2015-10-31 – 2015-11-01 (×2): 20 meq via ORAL
  Filled 2015-10-30 (×3): qty 2

## 2015-10-30 MED ORDER — HEPARIN (PORCINE) IN NACL 100-0.45 UNIT/ML-% IJ SOLN
1250.0000 [IU]/h | INTRAMUSCULAR | Status: DC
  Administered 2015-10-30: 850 [IU]/h via INTRAVENOUS
  Administered 2015-10-31: 1250 [IU]/h via INTRAVENOUS
  Filled 2015-10-30 (×2): qty 250

## 2015-10-30 MED ORDER — MONTELUKAST SODIUM 10 MG PO TABS
10.0000 mg | ORAL_TABLET | Freq: Every day | ORAL | Status: DC | PRN
  Filled 2015-10-30: qty 1

## 2015-10-30 MED ORDER — POTASSIUM CHLORIDE CRYS ER 20 MEQ PO TBCR
40.0000 meq | EXTENDED_RELEASE_TABLET | Freq: Once | ORAL | Status: AC
  Administered 2015-10-30: 40 meq via ORAL
  Filled 2015-10-30: qty 2

## 2015-10-30 MED ORDER — VITAMIN B-12 1000 MCG PO TABS
500.0000 ug | ORAL_TABLET | Freq: Every day | ORAL | Status: DC
  Administered 2015-10-31 – 2015-11-01 (×2): 500 ug via ORAL
  Filled 2015-10-30 (×2): qty 1

## 2015-10-30 MED ORDER — SODIUM CHLORIDE 0.9% FLUSH: 3.0000 mL | INTRAVENOUS | Status: DC | PRN

## 2015-10-30 MED ORDER — GLIPIZIDE 5 MG PO TABS
5.0000 mg | ORAL_TABLET | Freq: Every day | ORAL | Status: DC
  Administered 2015-11-01: 07:00:00 5 mg via ORAL
  Filled 2015-10-30: qty 1

## 2015-10-30 MED ORDER — HEPARIN BOLUS VIA INFUSION
4000.0000 [IU] | Freq: Once | INTRAVENOUS | Status: AC
  Administered 2015-10-30: 4000 [IU] via INTRAVENOUS
  Filled 2015-10-30: qty 4000

## 2015-10-30 MED ORDER — NITROGLYCERIN 0.4 MG SL SUBL: 0.4000 mg | SUBLINGUAL_TABLET | SUBLINGUAL | Status: DC | PRN

## 2015-10-30 MED ORDER — B-12 500 MCG PO TABS: 500.0000 ug | ORAL_TABLET | Freq: Every day | ORAL | Status: DC

## 2015-10-30 MED ORDER — VITAMIN D 1000 UNITS PO TABS
1000.0000 [IU] | ORAL_TABLET | Freq: Every day | ORAL | Status: DC
  Administered 2015-10-31 – 2015-11-01 (×2): 1000 [IU] via ORAL
  Filled 2015-10-30 (×2): qty 1

## 2015-10-30 MED ORDER — SODIUM CHLORIDE 0.9 % WEIGHT BASED INFUSION
3.0000 mL/kg/h | INTRAVENOUS | Status: DC
  Administered 2015-10-31: 3 mL/kg/h via INTRAVENOUS

## 2015-10-30 MED ORDER — ASPIRIN 81 MG PO TABS: 81.0000 mg | ORAL_TABLET | Freq: Every day | ORAL | Status: DC

## 2015-10-30 MED ORDER — INSULIN ASPART 100 UNIT/ML ~~LOC~~ SOLN
0.0000 [IU] | Freq: Three times a day (TID) | SUBCUTANEOUS | Status: DC
  Administered 2015-10-30 – 2015-10-31 (×3): 2 [IU] via SUBCUTANEOUS
  Administered 2015-11-01: 1 [IU] via SUBCUTANEOUS

## 2015-10-30 MED ORDER — SODIUM CHLORIDE 0.9% FLUSH
3.0000 mL | Freq: Two times a day (BID) | INTRAVENOUS | Status: DC
  Administered 2015-11-01: 3 mL via INTRAVENOUS

## 2015-10-30 MED ORDER — METOPROLOL TARTRATE 25 MG PO TABS
50.0000 mg | ORAL_TABLET | Freq: Two times a day (BID) | ORAL | Status: DC
  Administered 2015-10-30 – 2015-10-31 (×3): 50 mg via ORAL
  Filled 2015-10-30: qty 1
  Filled 2015-10-30: qty 2
  Filled 2015-10-30: qty 1

## 2015-10-30 MED ORDER — ONDANSETRON HCL 4 MG/2ML IJ SOLN: 4.0000 mg | Freq: Four times a day (QID) | INTRAMUSCULAR | Status: DC | PRN

## 2015-10-30 MED ORDER — MAGNESIUM SULFATE 2 GM/50ML IV SOLN
2.0000 g | Freq: Once | INTRAVENOUS | Status: AC
  Administered 2015-10-30: 2 g via INTRAVENOUS
  Filled 2015-10-30: qty 50

## 2015-10-30 MED ORDER — ASPIRIN EC 81 MG PO TBEC
81.0000 mg | DELAYED_RELEASE_TABLET | Freq: Every day | ORAL | Status: DC
  Administered 2015-11-01: 81 mg via ORAL
  Filled 2015-10-30: qty 1

## 2015-10-30 MED ORDER — ASPIRIN 81 MG PO CHEW
324.0000 mg | CHEWABLE_TABLET | Freq: Once | ORAL | Status: AC
  Administered 2015-10-30: 324 mg via ORAL
  Filled 2015-10-30: qty 4

## 2015-10-30 MED ORDER — SODIUM CHLORIDE 0.9 % IV SOLN: 250.0000 mL | INTRAVENOUS | Status: DC | PRN

## 2015-10-30 MED ORDER — SIMVASTATIN 20 MG PO TABS
20.0000 mg | ORAL_TABLET | Freq: Every day | ORAL | Status: DC
  Administered 2015-10-30 – 2015-10-31 (×2): 20 mg via ORAL
  Filled 2015-10-30 (×2): qty 1

## 2015-10-30 MED ORDER — ALPRAZOLAM 0.25 MG PO TABS
0.2500 mg | ORAL_TABLET | Freq: Two times a day (BID) | ORAL | Status: DC | PRN
Start: 2015-10-30 — End: 2015-11-01

## 2015-10-30 NOTE — ED Notes (Signed)
Patient transported to CT 

## 2015-10-30 NOTE — ED Notes (Signed)
Cards at bedside

## 2015-10-30 NOTE — ED Notes (Signed)
Pt unhappy about wait time-tried to explain process and encouraged pt that we will do everything we can to provide the best care for him at this time.

## 2015-10-30 NOTE — ED Notes (Signed)
Attempted report 

## 2015-10-30 NOTE — ED Provider Notes (Signed)
CSN: [id number]     Arrival date & time 10/30/15  0741 History   First MD Initiated Contact with Patient 10/30/15 [phone]     Chief Complaint  Patient presents with  . Tachycardia     (Consider location/radiation/quality/duration/timing/severity/associated sxs/prior Treatment) HPI Patient reports he was awakened about 5:30 this morning. He states he was working at his computer. He suddenly felt his heart racing. He reports he became very diaphoretic and weak feeling. Patient denies any chest pain. He did not have syncopal episode. He tried to smoke a cigarette but was too shaky and weak and dropped it. His family member states that his speech seems slightly slurred at the time this was occurring. Patient denies any recent symptoms leading up to this. Patient has hypertension, diabetes and reports compliance with his medications. He states over the past month he has been developing some swelling in his ankles and feet no associated calf pain. He reports he has felt well except that he fell in his driveway about 4 days ago and hurt his back and hit his head. He was seen in the emergency department treated. He reports he has been having lower back pain that is exacerbated by movements since that time. Patient reports taking daily aspirin but no blood thinners. Upon EMS arrival patient's initial heart rates were 180s to 200. They tried a trial of adenosine without conversion. EKGs during administration of adenosine were consistent with atrial fibrillation. They then administered a bolus of Cardizem and a subsequent drip. On route, prior to arrival patient's heart rate responded and on arrival was approximately 104. Patient reported feeling much improved upon arrival to the emergency department. Denies history of similar event. Past Medical History  Diagnosis Date  . Diabetes mellitus   . Hyperlipidemia     a. patient declined change to higher potency statin.  Marland Kitchen Hypertension   . CAD (coronary artery  disease)     a. s/p MI and PCI x 3 in 1996 and PCI in 1997.  . Prostate cancer ([place])   . Hemorrhoids   . Cataract   . Myocardial infarction ([place])   . Hyperlipidemia 02/11/2015  . Stroke (cerebrum) ([place])     a. CT head 10/2015 - Remote basal ganglia and right splenic infarcts again identified.  Marland Kitchen PVD (peripheral vascular disease) ([place])     a. ABIs 2008 mild diffuse plaque without significant stenosis.  . Tobacco abuse   . Heart murmur   . PVC's (premature ventricular contractions)     a. By event monitor 07/2015.   Past Surgical History  Procedure Laterality Date  . Eye surgery     Family History  Problem Relation Age of Onset  . Cancer Mother   . Heart disease Father    Social History  Substance Use Topics  . Smoking status: Current Every Day Smoker -- 0.50 packs/day for 30 years    Types: Cigarettes  . Smokeless tobacco: None     Comment: Patient has no desire to quit  . Alcohol Use: 0.0 oz/week    0 Standard drinks or equivalent per week     Comment: beer previously - as of 10/2015, denies etoh    Review of Systems 10 Systems reviewed and are negative for acute change except as noted in the HPI.    Allergies  Ace inhibitors and Benazepril  Home Medications   Prior to Admission medications   Medication Sig Start Date End Date Taking? Authorizing Provider  aspirin 81 MG tablet Take  81 mg by mouth daily. Reported on 05/08/2015   Yes Historical Provider, MD  cholecalciferol (VITAMIN D) 1000 UNITS tablet Take 1,000 Units by mouth daily.   Yes Historical Provider, MD  Cyanocobalamin (B-12 PO) Take 1 tablet by mouth daily.   Yes Historical Provider, MD  glipiZIDE (GLUCOTROL) 10 MG tablet Take 1 tablet (10 mg total) by mouth daily before breakfast. Patient taking differently: Take 5 mg by mouth daily before breakfast.  09/23/15  Yes Isla Pence, MD  metFORMIN (GLUCOPHAGE) 1000 MG tablet TAKE 1 TABLET (1,000 MG TOTAL) BY MOUTH 2 (TWO) TIMES DAILY WITH MEAL 05/08/15  Yes  Gay Filler Copland, MD  metoprolol succinate (TOPROL-XL) 50 MG 24 hr tablet Take 1 tablet (50 mg total) by mouth daily. Take with or immediately following a meal. 05/08/15  Yes Jessica C Copland, MD  montelukast (SINGULAIR) 10 MG tablet TAKE 1 TABLET (10 MG TOTAL) BY MOUTH DAILY AT BEDTIME. Patient taking differently: Take 10 mg by mouth daily as needed (allergies).  07/30/15  Yes Jaynee Eagles, PA-C  nitroGLYCERIN (NITROSTAT) 0.4 MG SL tablet PLACE 1 TABLET (0.4 MG TOTAL) UNDER THE TONGUE EVERY 5 (FIVE) MINUTES AS NEEDED. 01/19/15  Yes Tishira R Brewington, PA-C  potassium chloride (K-DUR) 10 MEQ tablet Take 20 mEq by mouth daily.  09/20/15  Yes Historical Provider, MD  simvastatin (ZOCOR) 20 MG tablet Take 1 tablet (20 mg total) by mouth daily at betime 05/08/15  Yes Jessica C Copland, MD  EPINEPHrine (EPIPEN 2-PAK) 0.3 mg/0.3 mL IJ SOAJ injection Inject 0.3 mLs (0.3 mg total) into the muscle once. Patient not taking: Reported on 10/26/2015 09/23/15   Posey Boyer, MD  ipratropium (ATROVENT) 0.03 % nasal spray Place 2 sprays into the nose 2 (two) times daily. Patient not taking: Reported on 10/30/2015 05/08/15 08/02/17  Gay Filler Copland, MD  predniSONE (DELTASONE) 20 MG tablet Take 2 tablets (40 mg total) by mouth daily. Patient not taking: Reported on 10/26/2015 10/07/15   Noemi Chapel, MD   BP 146/79 mmHg  Pulse 70  Temp(Src) 98.1 F (36.7 C) (Oral)  Resp 20  Ht 5\' 10"  (1.778 m)  Wt 153 lb (69.4 kg)  BMI 21.95 kg/m2  SpO2 95% Physical Exam  Constitutional: He is oriented to person, place, and time. He appears well-developed and well-nourished.  HENT:  Head: Normocephalic and atraumatic.  Eyes: EOM are normal. Pupils are equal, round, and reactive to light.  Neck: Neck supple.  Cardiovascular: Normal rate, regular rhythm, normal heart sounds and intact distal pulses.   Pulmonary/Chest: Effort normal and breath sounds normal.  Abdominal: Soft. Bowel sounds are normal. He exhibits no distension.  There is no tenderness.  Musculoskeletal: Normal range of motion. He exhibits edema. He exhibits no tenderness.  1+ pitting edema bilateral ankles symmetric. Calves are nontender. Skin condition good.  Neurological: He is alert and oriented to person, place, and time. He has normal strength. Coordination normal. GCS eye subscore is 4. GCS verbal subscore is 5. GCS motor subscore is 6.  Skin: Skin is warm, dry and intact.  Psychiatric: He has a normal mood and affect.    ED Course  Procedures (including critical care time) Labs Review Labs Reviewed  BASIC METABOLIC PANEL - Abnormal; Notable for the following:    Potassium 3.4 (*)    Glucose, Bld 217 (*)    Calcium 8.4 (*)    All other components within normal limits  CBC - Abnormal; Notable for the following:    Hemoglobin  12.8 (*)    HCT 37.7 (*)    All other components within normal limits  HEPATIC FUNCTION PANEL - Abnormal; Notable for the following:    Total Protein 6.2 (*)    Albumin 3.2 (*)    AST 13 (*)    ALT 7 (*)    All other components within normal limits  BRAIN NATRIURETIC PEPTIDE - Abnormal; Notable for the following:    B Natriuretic Peptide 144.2 (*)    All other components within normal limits  TROPONIN I - Abnormal; Notable for the following:    Troponin I 0.07 (*)    All other components within normal limits  URINALYSIS, ROUTINE W REFLEX MICROSCOPIC (NOT AT [facility]) - Abnormal; Notable for the following:    APPearance CLOUDY (*)    Glucose, UA 250 (*)    Ketones, ur 15 (*)    Protein, ur 30 (*)    All other components within normal limits  DIFFERENTIAL - Abnormal; Notable for the following:    Neutro Abs 8.0 (*)    All other components within normal limits  URINE MICROSCOPIC-ADD ON - Abnormal; Notable for the following:    Squamous Epithelial / LPF 0-5 (*)    Bacteria, UA RARE (*)    Casts HYALINE CASTS (*)    All other components within normal limits  PROTIME-INR  MAGNESIUM    Imaging Review Dg  Chest 2 View  10/30/2015  CLINICAL DATA:  Tachycardia. Weakness. Coronary artery disease and previous myocardial infarct. EXAM: CHEST  2 VIEW COMPARISON:  None. FINDINGS: The heart size and mediastinal contours are within normal limits. Mild atherosclerotic calcification of aortic arch seen. Both lungs are clear. No evidence of pleural effusion pneumothorax. The visualized skeletal structures are unremarkable. IMPRESSION: No active cardiopulmonary disease. Electronically Signed   By: Earle Gell M.D.   On: 10/30/2015 08:47   I have personally reviewed and evaluated these images and lab results as part of my medical decision-making.   EKG Interpretation   Date/Time:  Wednesday October 30 2015 07:46:36 EDT Ventricular Rate:  99 PR Interval:  125 QRS Duration: 115 QT Interval:  370 QTC Calculation: 475 R Axis:   75 Text Interpretation:  Sinus rhythm Probable left atrial enlargement Left  ventricular hypertrophy Inferior infarct, age indeterminate ST elevation,  consider anterior injury Confirmed by Johnney Killian, MD, Jeannie Done [id number]) on  10/30/2015 10:21:17 AM      Transmitted EMS EKG field interpreted by myself as rapid atrial fibrillation rate of 158.  Multiple EMS rhythm strips and EKGs reviewed by myself. Rhythm strip during administration of atropine showed rate slowing and atrial fibrillation. Patient then had EKG showing resumption of rapid A. fib. EMS administered Cardizem bolus and initiated drip. They did request amiodarone verbal order however I have advised to not administer that at this time and proceed to emergency department. They did not have Lopressor available for demonstration. Upon EMS arrival patient had significantly improved rate at 104 and subsequent EKG shows sinus rhythm with LVH.  Consult: Cardiology  (10:15), Wannetta Sender will assign for consultation in the emergency department. MDM   Final diagnoses:  Atrial fibrillation with rapid ventricular response [facility])   Patient onset of  atrial fibrillation home. It was rapid ventricular response. EMS therapy with Cardizem has controlled her rate and patient had spontaneous conversion just prior to arrival to the emergency department. Patient has been given oral metoprolol, aspirin and potassium replacement. At this time he feels much improved and is been up and  ambulatory without dyspnea or chest pain. Cardiology has been consult and evaluated the patient with plan for admission.    Charlesetta Shanks, MD 10/30/15 1249

## 2015-10-30 NOTE — ED Notes (Signed)
Joshua Ray [phone]

## 2015-10-30 NOTE — Progress Notes (Signed)
Received call from lab pt's Troponin is trending up. It is currently 1.77. Pt is on heparin drip and denies chest pain. Will continue to monitor.

## 2015-10-30 NOTE — H&P (Signed)
Cardiology H&P Note    Patient ID: Joshua Ray, MRN: [id number], DOB/AGE: 73/18/1942 73 y.o. Admit date: 10/30/2015   Date of Consult: 10/30/2015 Primary Physician: Kandice Hams, MD Primary Cardiologist: Dr. Oval Linsey  Chief Complaint: palpitations Reason for Consultation: atrial fib RVR Requesting MD: Dr. Colvin Caroli  HPI: Joshua Ray is a 75 y.o. male with history of CAD (s/p MI and PCI x 3 in 1996 and PCI in 1997), DM, HTN, HLD (patient declined change to higher potency statin), PVD (ABIs 2008 mild diffuse plaque without significant stenosis), ongoing tobacco abuse, heart murmur, remote strokes seen on brain imaging whom we are asked to see for atrial fibrillation. He last saw Dr. Oval Linsey 01/2015 at which time the patient preferred to hold off echo to eval murmur since he was asymptomatic. Due to high BP his metoprolol was switched to carvedilol and the patient was unhappy with this change because he said one dose caused him to pass out 10 minutes later. He did not seek care at that time. He was admitted 07/2015 with hypertensive encephalopathy. His PCP ordered an event monitor in 07/2015 which showed predominant NSR with PVCs, no afib. The patient does not know why this was done. He says he's been doing fine ever since his PCI in the 1990s without further chest pain. He is upset that he's had medicine changes in the last year. He is fairly inactive but denies any exertional anginal or dyspnea. He is not interested in quitting smoking. He denies any further EtOH (per Dr. Blenda Mounts notes in 03/2015, his favorite bar closed down).  Today around 5:30am he developed onset of rapid HR associated with diaphoresis. He denies any chest pain or SOB at that time. His friend says he had slurred speech during the flurry of activity while being checked out by EMS, still coherent and alert without any other neuro sx. When EMS arrived, strips showed AF RVR HR 160s-180s. He was given 10mg  diltiazem without  change, then a total of 30mg  of adenosine with transient slowing of afib. He was given another 10mg  of diltiazem and saline bolus with subsequent conversion to NSR. He says he gradually felt better afterwards. He received 324mg  ASA, 50mg  Lopressor orally and 8meq KCl in the ER. No fever, chills, LEE, recent syncope, history of bleeding (BRBPR, melena, hematemesis). Denies any further neuro symptoms. Labs notable for K 3.4, BNP 144, troponin 0.07, Hgb 12.8, glucose 217.  He was recently seen in the ER 6/3 for a mechanical fall in which he tripped over a cinder block. He sustained a head lac and hematoma and sutures were placed with recommended removal 6/10-6/13/17. CT head showed soft tissue swelling but no acute intracranial abnormality.   Past Medical History  Diagnosis Date  . Diabetes mellitus   . Hyperlipidemia     a. patient declined change to higher potency statin.  Marland Kitchen Hypertension   . CAD (coronary artery disease)     a. s/p MI and PCI x 3 in 1996 and PCI in 1997.  . Prostate cancer ([place])   . Hemorrhoids   . Cataract   . Myocardial infarction ([place])   . Hyperlipidemia 02/11/2015  . Stroke (cerebrum) ([place])     a. CT head 10/2015 - Remote basal ganglia and right splenic infarcts again identified.  Marland Kitchen PVD (peripheral vascular disease) ([place])     a. ABIs 2008 mild diffuse plaque without significant stenosis.  . Tobacco abuse   . Heart murmur   . PVC's (premature ventricular  contractions)     a. By event monitor 07/2015.      Surgical History:  Past Surgical History  Procedure Laterality Date  . Eye surgery       Home Meds: Prior to Admission medications   Medication Sig Start Date End Date Taking? Authorizing Provider  aspirin 81 MG tablet Take 81 mg by mouth daily. Reported on 05/08/2015    Historical Provider, MD  cholecalciferol (VITAMIN D) 1000 UNITS tablet Take 1,000 Units by mouth daily.    Historical Provider, MD  Cyanocobalamin (B-12 PO) Take 1 tablet by mouth daily.     Historical Provider, MD  EPINEPHrine (EPIPEN 2-PAK) 0.3 mg/0.3 mL IJ SOAJ injection Inject 0.3 mLs (0.3 mg total) into the muscle once. Patient not taking: Reported on 10/26/2015 09/23/15   Posey Boyer, MD  glipiZIDE (GLUCOTROL) 10 MG tablet Take 1 tablet (10 mg total) by mouth daily before breakfast. Patient not taking: Reported on 10/07/2015 09/23/15   Isla Pence, MD  glipiZIDE (GLUCOTROL) 5 MG tablet Take 5 mg by mouth daily before breakfast.  10/04/15   Historical Provider, MD  ipratropium (ATROVENT) 0.03 % nasal spray Place 2 sprays into the nose 2 (two) times daily. Patient taking differently: Place 2 sprays into the nose 2 (two) times daily as needed for rhinitis.  05/08/15 08/02/17  Gay Filler Copland, MD  metFORMIN (GLUCOPHAGE) 1000 MG tablet TAKE 1 TABLET (1,000 MG TOTAL) BY MOUTH 2 (TWO) TIMES DAILY WITH MEAL 05/08/15   Gay Filler Copland, MD  metoprolol succinate (TOPROL-XL) 50 MG 24 hr tablet Take 1 tablet (50 mg total) by mouth daily. Take with or immediately following a meal. 05/08/15   Gay Filler Copland, MD  montelukast (SINGULAIR) 10 MG tablet TAKE 1 TABLET (10 MG TOTAL) BY MOUTH DAILY AT BEDTIME. Patient not taking: Reported on 10/26/2015 07/30/15   Jaynee Eagles, PA-C  nitroGLYCERIN (NITROSTAT) 0.4 MG SL tablet PLACE 1 TABLET (0.4 MG TOTAL) UNDER THE TONGUE EVERY 5 (FIVE) MINUTES AS NEEDED. 01/19/15   Tishira R Brewington, PA-C  potassium chloride (K-DUR) 10 MEQ tablet Take 20 mEq by mouth daily.  09/20/15   Historical Provider, MD  predniSONE (DELTASONE) 20 MG tablet Take 2 tablets (40 mg total) by mouth daily. Patient not taking: Reported on 10/26/2015 10/07/15   Noemi Chapel, MD  simvastatin (ZOCOR) 20 MG tablet Take 1 tablet (20 mg total) by mouth daily at betime 05/08/15   Darreld Mclean, MD    Inpatient Medications:     Allergies:  Allergies  Allergen Reactions  . Ace Inhibitors   . Benazepril Swelling    Social History   Social History  . Marital Status: Single     Spouse Name: N/A  . Number of Children: N/A  . Years of Education: N/A   Occupational History  . vendor    Social History Main Topics  . Smoking status: Current Every Day Smoker -- 0.50 packs/day for 30 years    Types: Cigarettes  . Smokeless tobacco: Not on file     Comment: Patient has no desire to quit  . Alcohol Use: 0.0 oz/week    0 Standard drinks or equivalent per week     Comment: beer previously - as of 10/2015, denies etoh  . Drug Use: No  . Sexual Activity: Not on file   Other Topics Concern  . Not on file   Social History Narrative   Exercise walking     Family History  Problem Relation Age of  Onset  . Cancer Mother   . Heart disease Father      Review of Systems: All other systems reviewed and are otherwise negative except as noted above.  Labs:  Recent Labs  10/30/15 0800  TROPONINI 0.07*   Lab Results  Component Value Date   WBC 9.8 10/30/2015   HGB 12.8* 10/30/2015   HCT 37.7* 10/30/2015   MCV 89.3 10/30/2015   PLT 296 10/30/2015     Recent Labs Lab 10/30/15 0800 10/30/15 0801  NA  --  137  K  --  3.4*  CL  --  101  CO2  --  23  BUN  --  10  CREATININE  --  0.96  CALCIUM  --  8.4*  PROT 6.2*  --   BILITOT 1.1  --   ALKPHOS 64  --   ALT 7*  --   AST 13*  --   GLUCOSE  --  217*   Lab Results  Component Value Date   CHOL 143 11/09/2014   HDL 43 11/09/2014   LDLCALC 80 11/09/2014   TRIG 98 11/09/2014   No results found for: DDIMER  Radiology/Studies:  Dg Chest 2 View  10/30/2015  CLINICAL DATA:  Tachycardia. Weakness. Coronary artery disease and previous myocardial infarct. EXAM: CHEST  2 VIEW COMPARISON:  None. FINDINGS: The heart size and mediastinal contours are within normal limits. Mild atherosclerotic calcification of aortic arch seen. Both lungs are clear. No evidence of pleural effusion pneumothorax. The visualized skeletal structures are unremarkable. IMPRESSION: No active cardiopulmonary disease. Electronically Signed    By: Earle Gell M.D.   On: 10/30/2015 08:47   Ct Head Wo Contrast  10/26/2015  CLINICAL DATA:  75 year old male with acute head injury following fall today. Initial encounter. EXAM: CT HEAD WITHOUT CONTRAST TECHNIQUE: Contiguous axial images were obtained from the base of the skull through the vertex without intravenous contrast. COMPARISON:  08/08/2015 FINDINGS: Mild atrophy and chronic small-vessel white matter ischemic changes again noted. Remote basal ganglia and right splenic infarcts again identified. No acute intracranial abnormalities are identified, including mass lesion or mass effect, hydrocephalus, extra-axial fluid collection, midline shift, hemorrhage, or acute infarction. The visualized bony calvarium is unremarkable. Posterior right scalp soft tissue swelling/ hematoma identified. IMPRESSION: No evidence of acute intracranial abnormality. Posterior right scalp soft tissue swelling without fracture. Atrophy, chronic small-vessel white matter ischemic changes and remote infarcts as described. Electronically Signed   By: Margarette Canada M.D.   On: 10/26/2015 20:46    Wt Readings from Last 3 Encounters:  10/30/15 153 lb (69.4 kg)  10/26/15 150 lb (68.04 kg)  10/07/15 150 lb (68.04 kg)    EKG: EMS 12 leads - Atrial fib with RVR, inferolateral ST depression F/u ER EKG NSR 99bm, LVH, inferolateral ST Depression, hyperacute appearing T waves  Physical Exam: Blood pressure 146/79, pulse 70, temperature 98.1 F (36.7 C), temperature source Oral, resp. rate 20, height 5\' 10"  (1.778 m), weight 153 lb (69.4 kg), SpO2 95 %. Body mass index is 21.95 kg/(m^2). General: Well developed thin WM, in no acute distress. Head: Normocephalic, sclera non-icteric, no xanthomas, nares are without discharge. Well healing scalp lac posterior head with hematoma.  Neck: Negative for carotid bruits. JVD not elevated. Lungs: Clear bilaterally to auscultation without wheezes, rales, or rhonchi. Breathing is  unlabored. Heart: RRR with S1 S2. Soft SEM. No rubs or gallops appreciated. Abdomen: Soft, non-tender, non-distended with normoactive bowel sounds. No hepatomegaly. No rebound/guarding. No obvious abdominal  masses. Msk:  Strength and tone appear normal for age. Extremities: No clubbing or cyanosis. No edema.  Distal pedal pulses are 2+ and equal bilaterally. Neuro: Alert and oriented X 3. No facial asymmetry. No focal deficit. Moves all extremities spontaneously. Psych:  Responds to questions appropriately with a normal affect.     Assessment and Plan   75 y.o. male with history of CAD (s/p MI and PCI x 3 in 1996 and PCI in 1997), DM, HTN, HLD (patient declined change to higher potency statin), PVD (ABIs 2008 mild diffuse plaque without significant stenosis), ongoing tobacco abuse, heart murmur, remote strokes seen on brain imaging who was admitted with palpitations and found to have rapid AF by EMS. Labs notable for hypokalemia, mildly elevated troponin.  1. Transient atrial fibrillation - admit, check thyroid function, cycle troponins and follow lytes. CHADSVASC = 6 for HTN, DM, age, vascular disease, and occult strokes identified on imaging. Increase metoprolol to 50mg  BID (tartrate). Start heparin per pharmacy if head CT neg. Would consider NOAC after coronary status is clarified. He apparently had an echo at Dr. Thurman Coyer office at some point in the last year. We will try to obtain a copy of this - Wannetta Sender is working on it. If it was recent, no need to repeat.  2. CAD with mildly elevated troponin, inferolateral ST depression on EKG (more pronounced than 06-Aug-2015 particularly on EMS strips, with hyperacute T's V2-V3) - continue ASA for now. Cycle troponins. Would not be surprised if they go higher based on EKG appearance. Heparin per pharmacy if head CT neg. Continue BB and statin. He has refused to consider higher potency statins in the past. Check lipids in AM. Per d/w MD he has essentially already  failed his own stress test physiologically with EKG changes - will proceed with [facility] tomorrow. Risks and benefits of cardiac catheterization have been discussed with the patient.  These include bleeding, infection, kidney damage, stroke, heart attack, death.  The patient understands these risks and is willing to proceed.  3. Possible slurred speech - Repeat head CT. Question related to the flurry of activity going on all at once when his HR was fast. No focal neuro abnormalities at present. If negative will be OK for heparin.  4. HTN - follow.  5. Tobacco abuse - he is not interested in quitting unfortunately.  6. Hypokalemia - repleted by the ER. Per review of labs, has h/o significant hypokalemia and hypomagnesemia. Check Mg. Continue daily supp.  7. Head lac from 10/26/15 due to mech fall- if he is still in the hospital at that time, will need suture removal between 6/10-6/13.  Signed, Charlie Pitter PA-C 10/30/2015, 1:02 PM Pager: [phone]

## 2015-10-30 NOTE — ED Notes (Signed)
Lab notified to add mag.

## 2015-10-30 NOTE — Progress Notes (Signed)
CRITICAL VALUE ALERT  Critical value received:  Magnesium - 0.9  Date of notification:  10/30/2015  Time of notification:  [id number]  Critical value read back:Yes.    Nurse who received alert:  Magdalene River  MD notified (1st page):  Ignacia Bayley  Time of first page:  [phone]  MD notified (2nd page):  Time of second page:  Responding MD:  Ignacia Bayley  Time MD responded:  [phone]

## 2015-10-30 NOTE — ED Notes (Signed)
MD at bedside. 

## 2015-10-30 NOTE — ED Notes (Signed)
Pt ambulated around Pod D, C, and B. Pt tolerated well.

## 2015-10-30 NOTE — Progress Notes (Signed)
Mg resulted 0.9. NP on call received critical value and ordered repletion and f/u check. Dayna Dunn PA-C

## 2015-10-30 NOTE — ED Notes (Signed)
Pt presents via [facility] from home with tachycardia.  EMS was called out by friend for weakness and diaphoresis, HR intially 180-200.  EMS administered 10 cardizem with no change, then pt received 30 total of adenosine, pt would convert then HR would increase again, another 10 of cardizem decreasing HR to 105.  No hx of Afib.  750 cc NS received.  Pt denies pain or SOB.  A x 4, NAD.  20g R FA.   BP-156/74 P-105-106.

## 2015-10-30 NOTE — Progress Notes (Signed)
ANTICOAGULATION CONSULT NOTE - Initial Consult  Pharmacy Consult for heparin Indication: chest pain/ACS  Allergies  Allergen Reactions  . Ace Inhibitors   . Benazepril Swelling    Patient Measurements: Height: 5\' 10"  (177.8 cm) Weight: 153 lb (69.4 kg) IBW/kg (Calculated) : 73 Heparin Dosing Weight: 69.4  Vital Signs: Temp: 98.1 F (36.7 C) (06/07 0747) Temp Source: Oral (06/07 0747) BP: 170/71 mmHg (06/07 1430) Pulse Rate: 70 (06/07 1430)  Labs:  Recent Labs  10/30/15 0800 10/30/15 0801  HGB  --  12.8*  HCT  --  37.7*  PLT  --  296  LABPROT 13.7  --   INR 1.03  --   CREATININE  --  0.96  TROPONINI 0.07*  --     Estimated Creatinine Clearance: 68.3 mL/min (by C-G formula based on Cr of 0.96).   Medical History: Past Medical History  Diagnosis Date  . Diabetes mellitus   . Hyperlipidemia     a. patient declined change to higher potency statin.  Marland Kitchen Hypertension   . CAD (coronary artery disease)     a. s/p MI and PCI x 3 in 1996 and PCI in 1997.  . Prostate cancer ([place])   . Hemorrhoids   . Cataract   . Myocardial infarction ([place])   . Hyperlipidemia 02/11/2015  . Stroke (cerebrum) ([place])     a. CT head 10/2015 - Remote basal ganglia and right splenic infarcts again identified.  Marland Kitchen PVD (peripheral vascular disease) ([place])     a. ABIs 2008 mild diffuse plaque without significant stenosis.  . Tobacco abuse   . Heart murmur   . PVC's (premature ventricular contractions)     a. By event monitor 07/2015.    Medications:  Scheduled:  . insulin aspart  0-9 Units Subcutaneous TID WC    Assessment: 75 yo male with afib and CAD with elevated troponin. Pharmacy consulted to dose heparin. CBC wnl. Not on anticoagulation PTA.  Goal of Therapy:  Heparin level 0.3-0.7 units/ml Monitor platelets by anticoagulation protocol: Yes   Plan:  Give 4000 units bolus x 1 Start heparin infusion at 850 units/hr Check anti-Xa level in 6 hours and daily while on  heparin Continue to monitor H&H and platelets   Melburn Popper, PharmD Clinical Pharmacy Resident Pager: [phone] 10/30/2015 3:32 PM

## 2015-10-30 NOTE — Progress Notes (Signed)
ANTICOAGULATION CONSULT NOTE - f/u Consult  Pharmacy Consult for heparin Indication: chest pain/ACS  Allergies  Allergen Reactions  . Ace Inhibitors   . Benazepril Swelling    Patient Measurements: Height: 5\' 10"  (177.8 cm) Weight: 148 lb 4.8 oz (67.268 kg) IBW/kg (Calculated) : 73 Heparin Dosing Weight: 69.4  Vital Signs: Temp: 97.8 F (36.6 C) (06/07 1950) Temp Source: Oral (06/07 1950) BP: 178/84 mmHg (06/07 1950) Pulse Rate: 70 (06/07 1950)  Labs:  Recent Labs  10/30/15 0800 10/30/15 0801 10/30/15 1711 10/30/15 2029  HGB  --  12.8*  --   --   HCT  --  37.7*  --   --   PLT  --  296  --   --   LABPROT 13.7  --   --   --   INR 1.03  --   --   --   HEPARINUNFRC  --   --   --  0.22*  CREATININE  --  0.96  --   --   TROPONINI 0.07*  --  1.77*  --     Estimated Creatinine Clearance: 66.2 mL/min (by C-G formula based on Cr of 0.96).   Medical History: Past Medical History  Diagnosis Date  . Diabetes mellitus   . Hyperlipidemia     a. patient declined change to higher potency statin.  Marland Kitchen Hypertension   . CAD (coronary artery disease)     a. s/p MI and PCI x 3 in 1996 and PCI in 1997.  . Prostate cancer ([place])   . Hemorrhoids   . Cataract   . Myocardial infarction ([place])   . Hyperlipidemia 02/11/2015  . Stroke (cerebrum) ([place])     a. CT head 10/2015 - Remote basal ganglia and right splenic infarcts again identified.  Marland Kitchen PVD (peripheral vascular disease) ([place])     a. ABIs 2008 mild diffuse plaque without significant stenosis.  . Tobacco abuse   . Heart murmur   . PVC's (premature ventricular contractions)     a. By event monitor 07/2015.    Medications:  Scheduled:  . [START ON 10/31/2015] aspirin  81 mg Oral Pre-Cath  . [START ON 10/31/2015] aspirin EC  81 mg Oral Daily  . [START ON 10/31/2015] cholecalciferol  1,000 Units Oral Daily  . [START ON 10/31/2015] glipiZIDE  5 mg Oral QAC breakfast  . insulin aspart  0-9 Units Subcutaneous TID WC  . metoprolol  tartrate  50 mg Oral BID  . [START ON 10/31/2015] potassium chloride  20 mEq Oral Daily  . simvastatin  20 mg Oral q1800  . sodium chloride flush  3 mL Intravenous Q12H  . [START ON 10/31/2015] vitamin B-12  500 mcg Oral Daily    Assessment: 75 yo male with afib and CAD with elevated troponin. Pharmacy consulted to dose heparin. CBC wnl. Not on anticoagulation PTA.  PM HL: 0.22 low  Goal of Therapy:  Heparin level 0.3-0.7 units/ml Monitor platelets by anticoagulation protocol: Yes   Plan:  Increase heparin to 1000 units/hr Daily HL and CBC   Arlene Brickel S. Alford Highland, PharmD, [facility] Clinical Staff Pharmacist Pager [phone]  10/30/2015 9:29 PM

## 2015-10-31 ENCOUNTER — Encounter (HOSPITAL_COMMUNITY)
Admission: EM | Disposition: A | Discharge status: Home / Self Care | Payer: Self-pay | Source: Home / Self Care | Attending: Cardiovascular Disease

## 2015-10-31 DIAGNOSIS — Z9861 Coronary angioplasty status: Secondary | ICD-10-CM

## 2015-10-31 DIAGNOSIS — I4891 Unspecified atrial fibrillation: Secondary | ICD-10-CM | POA: Insufficient documentation

## 2015-10-31 DIAGNOSIS — I2510 Atherosclerotic heart disease of native coronary artery without angina pectoris: Secondary | ICD-10-CM | POA: Insufficient documentation

## 2015-10-31 DIAGNOSIS — I25118 Atherosclerotic heart disease of native coronary artery with other forms of angina pectoris: Secondary | ICD-10-CM

## 2015-10-31 DIAGNOSIS — E440 Moderate protein-calorie malnutrition: Secondary | ICD-10-CM | POA: Insufficient documentation

## 2015-10-31 HISTORY — PX: CARDIAC CATHETERIZATION: SHX172

## 2015-10-31 LAB — BASIC METABOLIC PANEL
ANION GAP: 9 (ref 5–15)
BUN: 10 mg/dL (ref 6–20)
CHLORIDE: 101 mmol/L (ref 101–111)
CO2: 26 mmol/L (ref 22–32)
Calcium: 8.6 mg/dL — ABNORMAL LOW (ref 8.9–10.3)
Creatinine, Ser: 0.78 mg/dL (ref 0.61–1.24)
GFR calc non Af Amer: >60 mL/min (ref >60)
Glucose, Bld: 150 mg/dL — ABNORMAL HIGH (ref 65–99)
POTASSIUM: 3.4 mmol/L — AB (ref 3.5–5.1)
SODIUM: 136 mmol/L (ref 135–145)

## 2015-10-31 LAB — LIPID PANEL
CHOL/HDL RATIO: 3.1 ratio
CHOLESTEROL: 141 mg/dL (ref 0–200)
HDL: 45 mg/dL (ref >40)
LDL Cholesterol: 81 mg/dL (ref 0–99)
Triglycerides: 76 mg/dL (ref <150)
VLDL: 15 mg/dL (ref 0–40)

## 2015-10-31 LAB — POCT ACTIVATED CLOTTING TIME: Activated Clotting Time: 422 seconds

## 2015-10-31 LAB — CBC
HCT: 36.8 % — ABNORMAL LOW (ref 39.0–52.0)
Hemoglobin: 12.3 g/dL — ABNORMAL LOW (ref 13.0–17.0)
MCH: 29.9 pg (ref 26.0–34.0)
MCHC: 33.4 g/dL (ref 30.0–36.0)
MCV: 89.5 fL (ref 78.0–100.0)
PLATELETS: 293 10*3/uL (ref 150–400)
RBC: 4.11 MIL/uL — AB (ref 4.22–5.81)
RDW: 12.9 % (ref 11.5–15.5)
WBC: 7.4 10*3/uL (ref 4.0–10.5)

## 2015-10-31 LAB — GLUCOSE, CAPILLARY
GLUCOSE-CAPILLARY: 133 mg/dL — AB (ref 65–99)
Glucose-Capillary: 162 mg/dL — ABNORMAL HIGH (ref 65–99)
Glucose-Capillary: 176 mg/dL — ABNORMAL HIGH (ref 65–99)
Glucose-Capillary: 138 mg/dL — ABNORMAL HIGH (ref 65–99)

## 2015-10-31 LAB — TROPONIN I: TROPONIN I: 0.9 ng/mL — AB (ref <0.031)

## 2015-10-31 LAB — HEPARIN LEVEL (UNFRACTIONATED): HEPARIN UNFRACTIONATED: 0.11 [IU]/mL — AB (ref 0.30–0.70)

## 2015-10-31 LAB — HEMOGLOBIN A1C
HEMOGLOBIN A1C: 7.5 % — AB (ref 4.8–5.6)
MEAN PLASMA GLUCOSE: 169 mg/dL

## 2015-10-31 LAB — MAGNESIUM: MAGNESIUM: 1.5 mg/dL — AB (ref 1.7–2.4)

## 2015-10-31 SURGERY — LEFT HEART CATH AND CORONARY ANGIOGRAPHY

## 2015-10-31 MED ORDER — CLOPIDOGREL BISULFATE 75 MG PO TABS
75.0000 mg | ORAL_TABLET | Freq: Every day | ORAL | Status: DC
  Administered 2015-11-01: 10:00:00 75 mg via ORAL
  Filled 2015-10-31: qty 1

## 2015-10-31 MED ORDER — SODIUM CHLORIDE 0.9 % IV SOLN
250.0000 mg | INTRAVENOUS | Status: DC | PRN
  Administered 2015-10-31: 1.75 mg/kg/h via INTRAVENOUS

## 2015-10-31 MED ORDER — HYDRALAZINE HCL 20 MG/ML IJ SOLN
INTRAMUSCULAR | Status: DC | PRN
  Administered 2015-10-31 (×2): 10 mg via INTRAVENOUS

## 2015-10-31 MED ORDER — SODIUM CHLORIDE 0.9 % IV SOLN: 250.0000 mL | INTRAVENOUS | Status: DC | PRN

## 2015-10-31 MED ORDER — HEPARIN BOLUS VIA INFUSION
2000.0000 [IU] | Freq: Once | INTRAVENOUS | Status: AC
  Administered 2015-10-31: 2000 [IU] via INTRAVENOUS
  Filled 2015-10-31: qty 2000

## 2015-10-31 MED ORDER — BIVALIRUDIN 250 MG IV SOLR
INTRAVENOUS | Status: AC
  Filled 2015-10-31: qty 250

## 2015-10-31 MED ORDER — HYDRALAZINE HCL 20 MG/ML IJ SOLN
10.0000 mg | INTRAMUSCULAR | Status: DC | PRN
  Administered 2015-11-01: 05:00:00 10 mg via INTRAVENOUS
  Filled 2015-10-31: qty 1

## 2015-10-31 MED ORDER — IOPAMIDOL (ISOVUE-370) INJECTION 76%
INTRAVENOUS | Status: AC
  Filled 2015-10-31: qty 100

## 2015-10-31 MED ORDER — LIDOCAINE HCL (PF) 1 % IJ SOLN
INTRAMUSCULAR | Status: AC
  Filled 2015-10-31: qty 30

## 2015-10-31 MED ORDER — HEPARIN SODIUM (PORCINE) 1000 UNIT/ML IJ SOLN
INTRAMUSCULAR | Status: AC
  Filled 2015-10-31: qty 1

## 2015-10-31 MED ORDER — LABETALOL HCL 5 MG/ML IV SOLN
INTRAVENOUS | Status: AC
  Filled 2015-10-31: qty 4

## 2015-10-31 MED ORDER — VERAPAMIL HCL 2.5 MG/ML IV SOLN
INTRAVENOUS | Status: AC
  Filled 2015-10-31: qty 2

## 2015-10-31 MED ORDER — SODIUM CHLORIDE 0.9 % WEIGHT BASED INFUSION
1.0000 mL/kg/h | INTRAVENOUS | Status: AC
  Administered 2015-10-31: 18:00:00 1 mL/kg/h via INTRAVENOUS

## 2015-10-31 MED ORDER — CLOPIDOGREL BISULFATE 300 MG PO TABS
ORAL_TABLET | ORAL | Status: AC
  Filled 2015-10-31: qty 1

## 2015-10-31 MED ORDER — HEPARIN SODIUM (PORCINE) 1000 UNIT/ML IJ SOLN
INTRAMUSCULAR | Status: DC | PRN
  Administered 2015-10-31: 3500 [IU] via INTRAVENOUS

## 2015-10-31 MED ORDER — VERAPAMIL HCL 2.5 MG/ML IV SOLN
INTRAVENOUS | Status: DC | PRN
  Administered 2015-10-31: 10 mL via INTRA_ARTERIAL

## 2015-10-31 MED ORDER — ANGIOPLASTY BOOK
Freq: Once | Status: AC
  Administered 2015-10-31: 21:00:00
  Filled 2015-10-31: qty 1

## 2015-10-31 MED ORDER — MIDAZOLAM HCL 2 MG/2ML IJ SOLN
INTRAMUSCULAR | Status: AC
  Filled 2015-10-31: qty 2

## 2015-10-31 MED ORDER — SODIUM CHLORIDE 0.9% FLUSH: 3.0000 mL | INTRAVENOUS | Status: DC | PRN

## 2015-10-31 MED ORDER — HEPARIN (PORCINE) IN NACL 2-0.9 UNIT/ML-% IJ SOLN
INTRAMUSCULAR | Status: AC
  Filled 2015-10-31: qty 1000

## 2015-10-31 MED ORDER — HEART ATTACK BOUNCING BOOK
Freq: Once | Status: AC
  Administered 2015-10-31: 21:00:00
  Filled 2015-10-31: qty 1

## 2015-10-31 MED ORDER — FENTANYL CITRATE (PF) 100 MCG/2ML IJ SOLN
INTRAMUSCULAR | Status: DC | PRN
  Administered 2015-10-31: 50 ug via INTRAVENOUS
  Administered 2015-10-31: 25 ug via INTRAVENOUS

## 2015-10-31 MED ORDER — BIVALIRUDIN BOLUS VIA INFUSION - CUPID
INTRAVENOUS | Status: DC | PRN
  Administered 2015-10-31: 49.65 mg via INTRAVENOUS

## 2015-10-31 MED ORDER — MIDAZOLAM HCL 2 MG/2ML IJ SOLN
INTRAMUSCULAR | Status: DC | PRN
  Administered 2015-10-31: 2 mg via INTRAVENOUS
  Administered 2015-10-31 (×2): 1 mg via INTRAVENOUS

## 2015-10-31 MED ORDER — OFF THE BEAT BOOK
Freq: Once | Status: AC
  Administered 2015-10-31: 12:00:00
  Filled 2015-10-31: qty 1

## 2015-10-31 MED ORDER — FENTANYL CITRATE (PF) 100 MCG/2ML IJ SOLN
INTRAMUSCULAR | Status: AC
  Filled 2015-10-31: qty 2

## 2015-10-31 MED ORDER — IOPAMIDOL (ISOVUE-370) INJECTION 76%
INTRAVENOUS | Status: DC | PRN
  Administered 2015-10-31: 195 mL via INTRA_ARTERIAL

## 2015-10-31 MED ORDER — CLOPIDOGREL BISULFATE 300 MG PO TABS
ORAL_TABLET | ORAL | Status: DC | PRN
  Administered 2015-10-31: 600 mg via ORAL

## 2015-10-31 MED ORDER — SODIUM CHLORIDE 0.9% FLUSH
3.0000 mL | Freq: Two times a day (BID) | INTRAVENOUS | Status: DC
  Administered 2015-11-01: 10:00:00 3 mL via INTRAVENOUS

## 2015-10-31 MED ORDER — LABETALOL HCL 5 MG/ML IV SOLN
INTRAVENOUS | Status: DC | PRN
  Administered 2015-10-31 (×2): 10 mg via INTRAVENOUS

## 2015-10-31 MED ORDER — HEPARIN (PORCINE) IN NACL 2-0.9 UNIT/ML-% IJ SOLN
INTRAMUSCULAR | Status: DC | PRN
  Administered 2015-10-31: 1000 mL

## 2015-10-31 MED ORDER — LIDOCAINE HCL (PF) 1 % IJ SOLN
INTRAMUSCULAR | Status: DC | PRN
  Administered 2015-10-31: 2 mL via SUBCUTANEOUS

## 2015-10-31 MED ORDER — HYDRALAZINE HCL 20 MG/ML IJ SOLN
INTRAMUSCULAR | Status: AC
  Filled 2015-10-31: qty 1

## 2015-10-31 SURGICAL SUPPLY — 20 items
BALLN ANGIOSCULPT RX 3.5X15 (BALLOONS) ×3 IMPLANT
BALLN EUPHORA RX 2.0X15 (BALLOONS) ×3
BALLN ~~LOC~~ EUPHORA RX 3.5X8 (BALLOONS) ×3
BALLOON EUPHORA RX 2.0X15 (BALLOONS) ×1 IMPLANT
BALLOON ~~LOC~~ EUPHORA RX 3.5X8 (BALLOONS) ×1 IMPLANT
CATH INFINITI 5FR ANG PIGTAIL (CATHETERS) ×3 IMPLANT
CATH OPTITORQUE TIG 4.0 5F (CATHETERS) ×3 IMPLANT
CATH VISTA GUIDE 6FR JR4 (CATHETERS) ×3 IMPLANT
CATH VISTA GUIDE 6FR XBLAD3.5 (CATHETERS) ×3 IMPLANT
DEVICE RAD COMP TR BAND LRG (VASCULAR PRODUCTS) ×3 IMPLANT
GLIDESHEATH SLEND A-KIT 6F 22G (SHEATH) ×3 IMPLANT
KIT ENCORE 26 ADVANTAGE (KITS) ×3 IMPLANT
KIT HEART LEFT (KITS) ×3 IMPLANT
PACK CARDIAC CATHETERIZATION (CUSTOM PROCEDURE TRAY) ×3 IMPLANT
STENT SYNERGY DES 2.25X20 (Permanent Stent) ×3 IMPLANT
SYR MEDRAD MARK V 150ML (SYRINGE) ×3 IMPLANT
TRANSDUCER W/STOPCOCK (MISCELLANEOUS) ×3 IMPLANT
TUBING CIL FLEX 10 FLL-RA (TUBING) ×3 IMPLANT
WIRE HI TORQ BMW 190CM (WIRE) ×3 IMPLANT
WIRE SAFE-T 1.5MM-J .035X260CM (WIRE) ×3 IMPLANT

## 2015-10-31 NOTE — Plan of Care (Signed)
Problem: Education: Goal: Knowledge of disease or condition will improve Outcome: Completed/Met Date Met:  10/31/15 Patient given Off the Beat booklet and discussed. Goal: Understanding of medication regimen will improve Outcome: Progressing Discussed with patient the reason for need of anticoagulation related to atrial fibrillation on admission.  Problem: Health Behavior/Discharge Planning: Goal: Ability to safely manage health-related needs after discharge will improve Outcome: Progressing Case Manager requested to verify insurance for anticoagulation on discharge

## 2015-10-31 NOTE — Progress Notes (Signed)
ANTICOAGULATION CONSULT NOTE - Follow Up Consult  Pharmacy Consult for heparin Indication: chest pain/ACS and atrial fibrillation   Labs:  Recent Labs  10/30/15 0800 10/30/15 0801 10/30/15 1711 10/30/15 2029 10/30/15 2259 10/31/15 0430  HGB  --  12.8*  --   --   --  12.3*  HCT  --  37.7*  --   --   --  36.8*  PLT  --  296  --   --   --  293  LABPROT 13.7  --   --   --   --   --   INR 1.03  --   --   --   --   --   HEPARINUNFRC  --   --   --  0.22*  --  0.11*  CREATININE  --  0.96  --   --   --  0.78  TROPONINI 0.07*  --  1.77*  --  1.21* 0.90*    Assessment: 75yo male remains subtherapeutic on heparin with lower level despite rate increase, no gtt issues overnight per RN.  Goal of Therapy:  Heparin level 0.3-0.7 units/ml   Plan:  Will rebolus with heparin 2000 units and increase gtt by 3-4 units/kg/hr to 1250 units/hr and check level in [place], PharmD, BCPS  10/31/2015,6:02 AM

## 2015-10-31 NOTE — Progress Notes (Signed)
Patient Name: Joshua Ray Date of Encounter: 10/31/2015  Principal Problem:   Atrial fibrillation with RVR ([place]) Active Problems:   Coronary atherosclerosis   Type 2 diabetes mellitus ([place])   Tobacco abuse   Hypokalemia   Essential hypertension   Laceration of head   Primary Cardiologist: New Patient Profile:75 y.o. male with history of CAD (s/p MI and PCI x 3 in 1996 and PCI in 1997), DM, HTN, HLD (patient declined change to higher potency statin), PVD (ABIs 2008 mild diffuse plaque without significant stenosis), ongoing tobacco abuse, heart murmur, remote strokes seen on brain imaging who was admitted with palpitations and found to have rapid AF by EMS. Labs notable for hypokalemia, mildly elevated troponin.   SUBJECTIVE: Frustrated, has multiple complaints. Denies chest pain, SOB.   OBJECTIVE Filed Vitals:   10/31/15 0032 10/31/15 0500 10/31/15 0745 10/31/15 0930  BP: 189/89 198/87 192/96 177/90  Pulse: 78 76 81   Temp: 97.9 F (36.6 C) 97.8 F (36.6 C) 97.6 F (36.4 C)   TempSrc: Oral Oral Oral   Resp:   18   Height:      Weight:  146 lb (66.225 kg)    SpO2: 91% 97% 98%     Intake/Output Summary (Last 24 hours) at 10/31/15 1000 Last data filed at 10/31/15 0800  Gross per 24 hour  Intake  42.22 ml  Output      0 ml  Net  42.22 ml   Filed Weights   10/30/15 0747 10/30/15 1655 10/31/15 0500  Weight: 153 lb (69.4 kg) 148 lb 4.8 oz (67.268 kg) 146 lb (66.225 kg)    PHYSICAL EXAM General: Well developed, well nourished, male in no acute distress. Head: Normocephalic, atraumatic.  Neck: Supple without bruits,No JVD. Lungs:  Resp regular and unlabored, CTA. Heart: RRR, S1, S2, no S3, S4, or murmur; no rub. Abdomen: Soft, non-tender, non-distended, BS + x 4.  Extremities: No clubbing, cyanosis, No edema.  Neuro: Alert and oriented X 3. Moves all extremities spontaneously. Psych: Normal affect.  LABS: CBC: Recent Labs  10/30/15 0800 10/30/15 0801  10/31/15 0430  WBC  --  9.8 7.4  NEUTROABS 8.0*  --   --   HGB  --  12.8* 12.3*  HCT  --  37.7* 36.8*  MCV  --  89.3 89.5  PLT  --  296 293   INR: Recent Labs  10/30/15 0800  INR [id number]   Basic Metabolic Panel: Recent Labs  10/30/15 0801 10/30/15 2259 10/31/15 0430  NA 137  --  136  K 3.4*  --  3.4*  CL 101  --  101  CO2 23  --  26  GLUCOSE 217*  --  150*  BUN 10  --  10  CREATININE 0.96  --  0.78  CALCIUM 8.4*  --  8.6*  MG 0.9* 1.7 1.5*   Liver Function Tests: Recent Labs  10/30/15 0800  AST 13*  ALT 7*  ALKPHOS 64  BILITOT 1.1  PROT 6.2*  ALBUMIN 3.2*   Cardiac Enzymes: Recent Labs  10/30/15 1711 10/30/15 2259 10/31/15 0430  TROPONINI 1.77* 1.21* 0.90*   BNP:  B NATRIURETIC PEPTIDE  Date/Time Value Ref Range Status  10/30/2015 08:00 AM 144.2* 0.0 - 100.0 pg/mL Final   Hemoglobin A1C: Recent Labs  10/30/15 1809  HGBA1C 7.5*   Fasting Lipid Panel: Recent Labs  10/31/15 0430  CHOL 141  HDL 45  LDLCALC 81  TRIG 76  CHOLHDL  3.1   Thyroid Function Tests: Recent Labs  10/30/15 1809  TSH 1.414     Current facility-administered medications:  .  0.9 %  sodium chloride infusion, 250 mL, Intravenous, PRN, Dayna N Dunn, PA-C .  [EXPIRED] 0.9% sodium chloride infusion, 3 mL/kg/hr, Intravenous, Continuous, Last Rate: 208.2 mL/hr at 10/31/15 0557, 3 mL/kg/hr at 10/31/15 0557 **FOLLOWED BY** 0.9% sodium chloride infusion, 1 mL/kg/hr, Intravenous, Continuous, Dayna N Dunn, PA-C, Last Rate: 69.4 mL/hr at 10/31/15 0709, 1 mL/kg/hr at 10/31/15 0709 .  acetaminophen (TYLENOL) tablet 650 mg, 650 mg, Oral, Q4H PRN, Dayna N Dunn, PA-C .  ALPRAZolam (XANAX) tablet 0.25 mg, 0.25 mg, Oral, BID PRN, Dayna N Dunn, PA-C .  aspirin EC tablet 81 mg, 81 mg, Oral, Daily, Skeet Latch, MD .  cholecalciferol (VITAMIN D) tablet 1,000 Units, 1,000 Units, Oral, Daily, Dayna N Dunn, PA-C, 1,000 Units at 10/31/15 0840 .  glipiZIDE (GLUCOTROL) tablet 5 mg, 5 mg, Oral,  QAC breakfast, Dayna N Dunn, PA-C, 5 mg at 10/31/15 0730 .  heparin ADULT infusion 100 units/mL (25000 units/238mL sodium chloride 0.45%), 1,250 Units/hr, Intravenous, Continuous, Veronda P Bryk, RPH, Last Rate: 12.5 mL/hr at 10/31/15 0710, 1,250 Units/hr at 10/31/15 0710 .  insulin aspart (novoLOG) injection 0-9 Units, 0-9 Units, Subcutaneous, TID WC, Dayna N Dunn, PA-C, 2 Units at 10/31/15 0841 .  metoprolol (LOPRESSOR) tablet 50 mg, 50 mg, Oral, BID, Dayna N Dunn, PA-C, 50 mg at 10/31/15 0840 .  montelukast (SINGULAIR) tablet 10 mg, 10 mg, Oral, Daily PRN, Dayna N Dunn, PA-C .  nitroGLYCERIN (NITROSTAT) SL tablet 0.4 mg, 0.4 mg, Sublingual, Q5 Min x 3 PRN, Dayna N Dunn, PA-C .  ondansetron (ZOFRAN) injection 4 mg, 4 mg, Intravenous, Q6H PRN, Dayna N Dunn, PA-C .  potassium chloride (K-DUR,KLOR-CON) CR tablet 20 mEq, 20 mEq, Oral, Daily, Dayna N Dunn, PA-C, 20 mEq at 10/31/15 0839 .  simvastatin (ZOCOR) tablet 20 mg, 20 mg, Oral, q1800, Dayna N Dunn, PA-C, 20 mg at 10/31/15 0839 .  sodium chloride flush (NS) 0.9 % injection 3 mL, 3 mL, Intravenous, Q12H, Dayna N Dunn, PA-C, 3 mL at 10/30/15 2200 .  sodium chloride flush (NS) 0.9 % injection 3 mL, 3 mL, Intravenous, PRN, Dayna N Dunn, PA-C .  vitamin B-12 (CYANOCOBALAMIN) tablet 500 mcg, 500 mcg, Oral, Daily, Skeet Latch, MD, 500 mcg at 10/31/15 0840 . sodium chloride 1 mL/kg/hr (10/31/15 0709)  . heparin 1,250 Units/hr (10/31/15 0710)    TELE:   NSR     ECG: ST, LVH with diffuse ST depression  Radiology/Studies: Dg Chest 2 View  10/30/2015  CLINICAL DATA:  Tachycardia. Weakness. Coronary artery disease and previous myocardial infarct. EXAM: CHEST  2 VIEW COMPARISON:  None. FINDINGS: The heart size and mediastinal contours are within normal limits. Mild atherosclerotic calcification of aortic arch seen. Both lungs are clear. No evidence of pleural effusion pneumothorax. The visualized skeletal structures are unremarkable. IMPRESSION: No  active cardiopulmonary disease. Electronically Signed   By: Earle Gell M.D.   On: 10/30/2015 08:47   Ct Head Wo Contrast  10/30/2015  CLINICAL DATA:  75 year old male with trouble speaking this morning. Slurred speech. Recent fall. Initial encounter. EXAM: CT HEAD WITHOUT CONTRAST TECHNIQUE: Contiguous axial images were obtained from the base of the skull through the vertex without intravenous contrast. COMPARISON:  [facility] head CT 10/26/2015, and earlier. FINDINGS: New partially visible fluid level in the right maxillary sinus. No stranding identified in the visible right orbit. Stable  mild mostly posterior ethmoid mucosal thickening and opacification. Visible tympanic cavities and mastoids remain clear. Regressed but not resolved right posterior convexity scalp hematoma. Underlying calvarium intact. No new scalp soft tissue abnormality. Calcified atherosclerosis at the skull base. No midline shift, mass effect, or evidence of intracranial mass lesion. No acute intracranial hemorrhage identified. No ventriculomegaly. Stable cerebral volume. Stable gray-white matter differentiation throughout the brain. Mild for age scattered cerebral white matter hypodensity. No cortically based acute infarct identified. No suspicious intracranial vascular hyperdensity. IMPRESSION: 1.  Stable non contrast CT appearance of the brain. 2. Regressed but not resolved right scalp hematoma without underlying fracture. 3. New but partially visible fluid level in the right maxillary sinus, could be inflammatory or posttraumatic. Electronically Signed   By: Genevie Ann M.D.   On: 10/30/2015 13:44     Current Medications:  . aspirin EC  81 mg Oral Daily  . cholecalciferol  1,000 Units Oral Daily  . glipiZIDE  5 mg Oral QAC breakfast  . insulin aspart  0-9 Units Subcutaneous TID WC  . metoprolol tartrate  50 mg Oral BID  . potassium chloride  20 mEq Oral Daily  . simvastatin  20 mg Oral q1800  . sodium chloride flush  3  mL Intravenous Q12H  . vitamin B-12  500 mcg Oral Daily   . sodium chloride 1 mL/kg/hr (10/31/15 0709)  . heparin 1,250 Units/hr (10/31/15 0710)    ASSESSMENT AND PLAN: Principal Problem:   Atrial fibrillation with RVR ([facility]) Active Problems:   Coronary atherosclerosis   Type 2 diabetes mellitus ([facility])   Tobacco abuse   Hypokalemia   Essential hypertension   Laceration of head  1. Transient atrial fibrillation - admit, check thyroid function, cycle troponins and follow lytes. CHADSVASC = 6 for HTN, DM, age, vascular disease, and occult strokes identified on imaging. Increase metoprolol to 50mg  BID (tartrate). On heparin gtt, head CT neg yesterday for anything acute. Would consider NOAC after coronary status is clarified. He apparently had an echo at Dr. Thurman Coyer office at some point in the last year. We will try to obtain a copy of this - Wannetta Sender is working on it. If it was recent, no need to repeat.  2. CAD with mildly elevated troponin, inferolateral ST depression on EKG (more pronounced than 08/14/2015 particularly on EMS strips, with hyperacute T's V2-V3) - continue ASA for now. Troponin 0.07>>1.77>>1.21>>0.90.   Continue BB and statin. He has refused to consider higher potency statins in the past. Check lipids in AM. Per d/w MD he has essentially already failed his own stress test physiologically with EKG changes - will proceed with [facility] tomorrow. Risks and benefits of cardiac catheterization have been discussed with the patient. These include bleeding, infection, kidney damage, stroke, heart attack, death. The patient understands these risks and is willing to proceed.   3. Possible slurred speech - Repeat head CT done, no acute abnormalities. Question related to the flurry of activity going on all at once when his HR was fast. No focal neuro abnormalities at present.  4. HTN - Hypertensive with SBP 177-198. On metoprolol 50mg  BID. MD to advise adding amlodipine or ACE. Awaiting echo results  from Dr. Thurman Coyer office. If EF reduced, add ACE.   5. Tobacco abuse - he is not interested in quitting unfortunately.  6. Hypokalemia - 3.4 this am, he has gotten 84mEq this am.   7. Head lac from 10/26/15 due to mech fall- if he is still in the hospital at  that time, will need suture removal between 6/10-6/13.  8. Hypomagnesemia: Got 2g of Mg yesterday for Mg of 0.9, this am Mg is 1.5. He would benefit form daily oral Mg. Although pt. Is very reluctant to start new medications.     Signed, Arbutus Leas , NP 10:00 AM 10/31/2015 Pager [phone]

## 2015-10-31 NOTE — Plan of Care (Signed)
Problem: Skin Integrity: Goal: Risk for impaired skin integrity will decrease Outcome: Progressing Patient with laceration to right back of head with sutures.  Minimal drainage.  Patient provided with gauze and vasaline gauze.

## 2015-10-31 NOTE — Interval H&P Note (Signed)
History and Physical Interval Note:  10/31/2015 1:08 PM  Joshua Ray  has presented today for surgery, with the diagnosis of non-ST elevation MI. The various methods of treatment have been discussed with the patient and family. After consideration of risks, benefits and other options for treatment, the patient has consented to  Procedure(s): Left Heart Cath and Coronary Angiography (N/A) with possible percutaneous coronary intervention as a surgical intervention .  The patient's history has been reviewed, patient examined, no change in status, stable for surgery.  I have reviewed the patient's chart and labs.  Questions were answered to the patient's satisfaction.     Cath Lab Visit (complete for each Cath Lab visit)  Clinical Evaluation Leading to the Procedure:   ACS: Yes.    Non-ACS:    Anginal Classification: CCS III  Anti-ischemic medical therapy: Minimal Therapy (1 class of medications)  Non-Invasive Test Results: No non-invasive testing performed  Prior CABG: No previous CABG   TIMI Score  Patient Information:  TIMI Score is 5  Revascularization of the presumed culprit artery  A (9)  Indication: 11; Score: 9 TIMI Score  Patient Information:  TIMI Score is 5  Revascularization of multiple coronary arteries when the culprit artery cannot clearly be determined  A (9)  Indication: 12; Score: 9    Glenetta Hew

## 2015-10-31 NOTE — Progress Notes (Signed)
Initial Nutrition Assessment  DOCUMENTATION CODES:   Non-severe (moderate) malnutrition in context of chronic illness  INTERVENTION:  Monitor nutritional needs. Encouraged PO's once diet advanced.    NUTRITION DIAGNOSIS:   Malnutrition related to chronic illness as evidenced by mild depletion of body fat, mild depletion of muscle mass.  GOAL:   Patient will meet greater than or equal to 90% of their needs  MONITOR:   PO intake, Diet advancement, Labs, Weight trends, Skin, I & O's  REASON FOR ASSESSMENT:   Malnutrition Screening Tool    ASSESSMENT:   Pt with history of CAD (s/p MI and PCI x 3 in 1996 and PCI in 1997), DM, HTN, HLD (patient declined change to higher potency statin), PVD, ongoing tobacco abuse, heart murmur, remote strokes seen on brain imaging who was admitted with palpitations and found to have rapid AF by EMS. Hypokalemia\  Pt eats TID PTA and reports a good appetite. He denies N/V. He reports weighing 190 lbs about a 1.5 years ago at which point he lost about 40 lbs when he quit drinking beer. Reports drinking 8-10 beers/day. Pt denies weight loss in the past 6 months. His UBW since he quit drinking has been around 150 lbs. This is consistent with current body weight of 146 lbs.  Per chart, pt highest weight in past 2 years was 178 lbs. He has lost 20 lbs since 02/11/2015 (12% in 10 months, not significant). NFPE reveals mild muscle and fat depletion, no edema. He meets criteria for moderate malnutrition.  He does not drink supplements at home and refuses them here. Currently NPO and hungry, will check intake at f/u and offer supplements and snacks again if intake is poor.   Labs reviewed; CBGs 162-241, K 3.4, Ca 8.6, Mag 1.5, lipid profile WNL.  Meds reviewed; KCl, B12.   Diet Order:  Diet NPO time specified Except for: Sips with Meds  Skin:  Reviewed, no issues  Last BM:  unknown   Height:   Ht Readings from Last 1 Encounters:  10/30/15 5\' 10"   (1.778 m)    Weight:   Wt Readings from Last 1 Encounters:  10/31/15 146 lb (66.225 kg)    Ideal Body Weight:  75.5 kg  BMI:  Body mass index is 20.95 kg/(m^2).  Estimated Nutritional Needs:   Kcal:  1650-1850   Protein:  75-85 g   Fluid:  1.6-1.8 L  EDUCATION NEEDS:   No education needs identified at this time  Geoffery Lyons, [place] Dietetic Intern Pager [phone]

## 2015-10-31 NOTE — Care Management Note (Signed)
Case Management Note  Patient Details  Name: CHRISTIEN PETRAUSKAS MRN: [id number] Date of Birth: 1940/08/03  Subjective/Objective:    Pt  In for Atrial Fib-positive troponin and plan for cardiac cath.              Action/Plan: CM will monitor for additional home needs. CM did a benefits check for Eliquis vs Xarelto. Both medications will need prior approval.  Unsure of which medication pt will be d/c on.  S/W MALLORY @ [facility] # [id number]   ELIQUIS 2.5 MG BID (30 )  AND 5 MG   COVER- YES  CO-PAY-$ 45.00  TIER- E DRUG  PRIOR APPROVAL - YES # [id number]  PH[facility]ACY : [facility]  MAIL-ORDER- $ 125.00 90 DAY SUPPLY  $135.00 3 MO AT RETAIL    XARELTO 20 MG PO DAILY   COVER- YES  CO-PAY- $45.00  (30 ) Q/L  TIER- 3 DRUG  PRIOR APPROVAL - YES # [id number]  [facility] [id number]   Expected Discharge Date:                  Expected Discharge Plan:  Home/Self Care  In-House Referral:     Discharge planning Services  CM Consult  Post Acute Care Choice:    Choice offered to:     DME Arranged:    DME Agency:     HH Arranged:    HH Agency:     Status of Service:  In process, will continue to follow  Medicare Important Message Given:    Date Medicare IM Given:    Medicare IM give by:    Date Additional Medicare IM Given:    Additional Medicare Important Message give by:     If discussed at [place] of Stay Meetings, dates discussed:    Additional Comments:  Bethena Roys, RN 10/31/2015, 2:41 PM

## 2015-11-01 ENCOUNTER — Encounter (HOSPITAL_COMMUNITY): Payer: Self-pay | Admitting: Cardiology

## 2015-11-01 DIAGNOSIS — I10 Essential (primary) hypertension: Secondary | ICD-10-CM

## 2015-11-01 DIAGNOSIS — Z9861 Coronary angioplasty status: Secondary | ICD-10-CM

## 2015-11-01 DIAGNOSIS — I2510 Atherosclerotic heart disease of native coronary artery without angina pectoris: Secondary | ICD-10-CM

## 2015-11-01 LAB — GLUCOSE, CAPILLARY
GLUCOSE-CAPILLARY: 158 mg/dL — AB (ref 65–99)
Glucose-Capillary: 140 mg/dL — ABNORMAL HIGH (ref 65–99)

## 2015-11-01 MED ORDER — DIPHENHYDRAMINE HCL 50 MG/ML IJ SOLN
12.5000 mg | Freq: Once | INTRAMUSCULAR | Status: DC
  Filled 2015-11-01: qty 1

## 2015-11-01 MED ORDER — METOPROLOL TARTRATE 25 MG PO TABS
100.0000 mg | ORAL_TABLET | Freq: Two times a day (BID) | ORAL | Status: DC
  Administered 2015-11-01: 10:00:00 100 mg via ORAL
  Filled 2015-11-01: qty 4

## 2015-11-01 MED ORDER — AMLODIPINE BESYLATE 5 MG PO TABS
5.0000 mg | ORAL_TABLET | Freq: Every day | ORAL | Status: DC
  Filled 2015-11-01: qty 1

## 2015-11-01 MED ORDER — TICAGRELOR 90 MG PO TABS: 90.0000 mg | ORAL_TABLET | Freq: Two times a day (BID) | ORAL | Status: AC

## 2015-11-01 MED ORDER — CLOPIDOGREL BISULFATE 75 MG PO TABS: 75.0000 mg | ORAL_TABLET | Freq: Every day | ORAL | Status: DC

## 2015-11-01 MED ORDER — HYDRALAZINE HCL 25 MG PO TABS
25.0000 mg | ORAL_TABLET | Freq: Three times a day (TID) | ORAL | Status: DC
  Filled 2015-11-01: qty 1

## 2015-11-01 MED ORDER — DIPHENHYDRAMINE HCL 25 MG PO CAPS
25.0000 mg | ORAL_CAPSULE | Freq: Once | ORAL | Status: AC
  Administered 2015-11-01: 12:00:00 25 mg via ORAL
  Filled 2015-11-01: qty 1

## 2015-11-01 MED FILL — BRILINTA 90 MG TABLET: 90 | 30 days supply | Qty: 60 | Fill #0

## 2015-11-01 NOTE — Care Management Note (Addendum)
Case Management Note  Patient Details  Name: Joshua Ray MRN: [id number] Date of Birth: [birth year]  Subjective/Objective:     Atrial fibrillation with RVR                Action/Plan: Discharge Planning: AVS reviewed:  NCM spoke to pt and offered choice for HH/provided [facility] list. Pt requested [facility] for [facility]. Contacted [facility] for new referral. Pt states he lives at home alone. He still drives and independent prior to admission.  Spoke to [employer], pt will have a copay for $305 for 3 months supply and $215 for month. No prior auth needed for medication.   Unit RN faxed Rx to [facility] and Brilinta 30 day free trial card used. Pt will have medication prior to dc.   Received a call from [facility] and they cannot service pt for [facility]. Pt is Medicare and not homebound.   Pt's son, Harrell Gave # [phone] is coming to pick him up. Discussed with pt copay for Brilinta once he has completed 30 days. States he cannot afford $215 for medication. Explained he will need to follow up with his Cardiologist at appt to make him aware.   PCP - Seward Carol MD    Expected Discharge Date:                  Expected Discharge Plan:  Home/Self Care  In-House Referral:  NA  Discharge planning Services  CM Consult  Post Acute Care Choice:  NA Choice offered to:  NA  DME Arranged:  N/A DME Agency:  NA  HH Arranged:  NA HH Agency:  NA  Status of Service:  Completed, signed off  Medicare Important Message Given:  Yes Date Medicare IM Given:    Medicare IM give by:    Date Additional Medicare IM Given:    Additional Medicare Important Message give by:     If discussed at [place] of Stay Meetings, dates discussed:    Additional Comments:  Erenest Rasher, RN 11/01/2015, 3:31 PM

## 2015-11-01 NOTE — Progress Notes (Signed)
Patient Name: Joshua Ray Date of Encounter: 11/01/2015     Principal Problem:   Atrial fibrillation with RVR ([place]) Active Problems:   Coronary atherosclerosis   Type 2 diabetes mellitus (HCC)   Tobacco abuse   Hypokalemia   Essential hypertension   Laceration of head   Malnutrition of moderate degree   Atrial fibrillation with rapid ventricular response ([place])   CAD S/P percutaneous coronary angioplasty    SUBJECTIVE  Not happy with getting the cath. Michela Pitcher it was the worst decision he has made. No CP or SOB. Says he needs to get home today but doesn't have a ride.   CURRENT MEDS . aspirin EC  81 mg Oral Daily  . cholecalciferol  1,000 Units Oral Daily  . clopidogrel  75 mg Oral Q breakfast  . glipiZIDE  5 mg Oral QAC breakfast  . insulin aspart  0-9 Units Subcutaneous TID WC  . metoprolol tartrate  50 mg Oral BID  . potassium chloride  20 mEq Oral Daily  . simvastatin  20 mg Oral q1800  . sodium chloride flush  3 mL Intravenous Q12H  . sodium chloride flush  3 mL Intravenous Q12H  . vitamin B-12  500 mcg Oral Daily    OBJECTIVE  Filed Vitals:   10/31/15 2000 10/31/15 2136 11/01/15 0429 11/01/15 0506  BP: 196/72 174/76 214/93 163/46  Pulse: 88 101 81 82  Temp:   98.1 F (36.7 C)   TempSrc: Oral  Oral   Resp: 15 18 17 19   Height:      Weight:      SpO2: 97% 96% 96% 96%    Intake/Output Summary (Last 24 hours) at 11/01/15 0750 Last data filed at 10/31/15 1930  Gross per 24 hour  Intake 1062.43 ml  Output      0 ml  Net 1062.43 ml   Filed Weights   10/30/15 0747 10/30/15 1655 10/31/15 0500  Weight: 153 lb (69.4 kg) 148 lb 4.8 oz (67.268 kg) 146 lb (66.225 kg)    PHYSICAL EXAM  General: Pleasant, NAD. Neuro: Alert and oriented X 3. Moves all extremities spontaneously. Psych: Normal affect. HEENT:  Normal  Neck: Supple without bruits or JVD. Lungs:  Resp regular and unlabored, CTA. Heart: RRR no s3, s4, or murmurs. Abdomen: Soft, non-tender,  non-distended, BS + x 4.  Extremities: No clubbing, cyanosis or edema. DP/PT/Radials 2+ and equal bilaterally.  Accessory Clinical Findings  CBC  Recent Labs  10/30/15 0800 10/30/15 0801 10/31/15 0430  WBC  --  9.8 7.4  NEUTROABS 8.0*  --   --   HGB  --  12.8* 12.3*  HCT  --  37.7* 36.8*  MCV  --  89.3 89.5  PLT  --  296 0000000   Basic Metabolic Panel  Recent Labs  10/30/15 0801 10/30/15 2259 10/31/15 0430  NA 137  --  136  K 3.4*  --  3.4*  CL 101  --  101  CO2 23  --  26  GLUCOSE 217*  --  150*  BUN 10  --  10  CREATININE 0.96  --  0.78  CALCIUM 8.4*  --  8.6*  MG 0.9* 1.7 1.5*   Liver Function Tests  Recent Labs  10/30/15 0800  AST 13*  ALT 7*  ALKPHOS 64  BILITOT 1.1  PROT 6.2*  ALBUMIN 3.2*   No results for input(s): LIPASE, AMYLASE in the last 72 hours. Cardiac Enzymes  Recent Labs  10/30/15  1711 10/30/15 2259 10/31/15 0430  TROPONINI 1.77* 1.21* 0.90*   BNP Invalid input(s): POCBNP D-Dimer No results for input(s): DDIMER in the last 72 hours. Hemoglobin A1C  Recent Labs  10/30/15 1809  HGBA1C 7.5*   Fasting Lipid Panel  Recent Labs  10/31/15 0430  CHOL 141  HDL 45  LDLCALC 81  TRIG 76  CHOLHDL 3.1   Thyroid Function Tests  Recent Labs  10/30/15 1809  TSH 1.414    TELE  NSR with some sinus tachy and PVCs  Radiology/Studies  Dg Chest 2 View  10/30/2015  CLINICAL DATA:  Tachycardia. Weakness. Coronary artery disease and previous myocardial infarct. EXAM: CHEST  2 VIEW COMPARISON:  None. FINDINGS: The heart size and mediastinal contours are within normal limits. Mild atherosclerotic calcification of aortic arch seen. Both lungs are clear. No evidence of pleural effusion pneumothorax. The visualized skeletal structures are unremarkable. IMPRESSION: No active cardiopulmonary disease. Electronically Signed   By: Earle Gell M.D.   On: 10/30/2015 08:47   Ct Head Wo Contrast  10/30/2015  CLINICAL DATA:  75 year old male with  trouble speaking this morning. Slurred speech. Recent fall. Initial encounter. EXAM: CT HEAD WITHOUT CONTRAST TECHNIQUE: Contiguous axial images were obtained from the base of the skull through the vertex without intravenous contrast. COMPARISON:  [facility] head CT 10/26/2015, and earlier. FINDINGS: New partially visible fluid level in the right maxillary sinus. No stranding identified in the visible right orbit. Stable mild mostly posterior ethmoid mucosal thickening and opacification. Visible tympanic cavities and mastoids remain clear. Regressed but not resolved right posterior convexity scalp hematoma. Underlying calvarium intact. No new scalp soft tissue abnormality. Calcified atherosclerosis at the skull base. No midline shift, mass effect, or evidence of intracranial mass lesion. No acute intracranial hemorrhage identified. No ventriculomegaly. Stable cerebral volume. Stable gray-white matter differentiation throughout the brain. Mild for age scattered cerebral white matter hypodensity. No cortically based acute infarct identified. No suspicious intracranial vascular hyperdensity. IMPRESSION: 1.  Stable non contrast CT appearance of the brain. 2. Regressed but not resolved right scalp hematoma without underlying fracture. 3. New but partially visible fluid level in the right maxillary sinus, could be inflammatory or posttraumatic. Electronically Signed   By: Genevie Ann M.D.   On: 10/30/2015 13:44   Ct Head Wo Contrast  10/26/2015  CLINICAL DATA:  75 year old male with acute head injury following fall today. Initial encounter. EXAM: CT HEAD WITHOUT CONTRAST TECHNIQUE: Contiguous axial images were obtained from the base of the skull through the vertex without intravenous contrast. COMPARISON:  08/08/2015 FINDINGS: Mild atrophy and chronic small-vessel white matter ischemic changes again noted. Remote basal ganglia and right splenic infarcts again identified. No acute intracranial abnormalities are  identified, including mass lesion or mass effect, hydrocephalus, extra-axial fluid collection, midline shift, hemorrhage, or acute infarction. The visualized bony calvarium is unremarkable. Posterior right scalp soft tissue swelling/ hematoma identified. IMPRESSION: No evidence of acute intracranial abnormality. Posterior right scalp soft tissue swelling without fracture. Atrophy, chronic small-vessel white matter ischemic changes and remote infarcts as described. Electronically Signed   By: Margarette Canada M.D.   On: 10/26/2015 20:46   10/31/15 Procedures    Coronary Stent Intervention   Left Heart Cath and Coronary Angiography    Conclusion    1. 2nd Mrg-2 lesion, 80% stenosed. Post intervention - Synergy DES 2.25 mm x 20 mm (2.4 mm), there is a 0% residual stenosis. 2. 1st Mrg lesion, 99% stenosed. Very small caliber subtotal vessel. 2  small for PCI 3. Prox RCA to Mid RCA lesion, 75% stenosed - in-stent restenosis. Post scoring balloon angioplasty (PTCA) only intervention, there is a 20% residual stenosis. The lesion was previously treated with several bare-metal stents in the 1990s.). 4. 2nd RPLB lesion, 50% stenosed. 5. There is mild left ventricular systolic dysfunction.  Patient has diffuse multivessel mild disease, with 2 areas of roughly 80% stenosis and a subtotally occluded small OM1 branch. Successful PCI of the OM 2 lesion and scoring balloon angioplasty (PTCA) of the in-stent restenosis in the RCA.  Plan:  Transport patient to post procedure unit for ongoing care.  Aspirin plus Plavix for minimum of 1 month, then can stop aspirin as the patient will likely be on anticoagulation for his atrial fibrillation.  Continue aggressive risk factor modification.  PRN IV hydralazine ordered for hypertension     ASSESSMENT AND PLAN 75 y.o. male with history of CAD (s/p MI and PCI x 3 in 1996 and PCI in 1997), DM, HTN, HLD (patient declined change to higher potency statin), PVD (ABIs  2008 mild diffuse plaque without significant stenosis), ongoing tobacco abuse, heart murmur, remote strokes seen on brain imaging who was admitted with palpitations and found to have rapid AF by EMS. Labs notable for hypokalemia and mildly elevated troponin.   1. Newly diagnosed transient atrial fibrillation: now maintaining NSR -- CHADSVASC = 6 for HTN, DM, age, vascular disease, and occult strokes identified on imaging. He has been metoprolol tart 50mg  BID. He was initially started on a heparin gtt, with plans to convert him to a NOAC after cath. However, he is very reluctant to start any new medications and regularly drinks heavily and has had multiple falls in the recent past. I am not sure he is a good candidate for triple therapy with ASA, plavix and NOAC. Will plan for no long term anticoagulation for now.   2. CAD with mildly elevated troponin, inferolateral ST depression on EKG (more pronounced than 07-29-2015 particularly on EMS strips, with hyperacute T's V2-V3).Troponin 0.07>>1.77>>1.21>>0.90.  -- Due to his elevated cardiac enzymes and ECG abnormalities, it was decided to proceed with coronary angiography. Left heart cath on 11/01/15 showed diffuse multivessel mild disease with 2 areas of roughly 80% stenosis and a subtotally occluded small OM1 branch with successful PCI of the OM 2 lesion and scoring balloon angioplasty (PTCA) of the in-stent restenosis in the RCA. -- Continue BB. He has refused to consider higher potency statins in the past. He has been placed on DAPT with   3. Possible slurred speech - Repeat head CT done, no acute abnormalities. Question related to the flurry of activity going on all at once when his HR was fast. No focal neuro abnormalities at present.  4. HTN - Hypertensive with SBP consistently 177-214. On metoprolol 50mg  BID. He previously had a syncopal episode after Dr. Oval Linsey switched him to Coreg 25mg  BID. Therefore, we will increase metop tart to 100mg  BID and add  amlodipine 5mg  daily.   5. Tobacco abuse - he is not interested in quitting unfortunately.  6. Hypokalemia - 3.4 this am, he has gotten 14mEq this am.   7. Head lac from 10/26/15 due to mech fall- if he is still in the hospital at that time, will need suture removal between 6/10-6/13. He can do this with primary care  8. Hypomagnesemia: Mg of 0.9 on admission. Mg is 1.5 on 10/30/15 after supplementation. Refusing all lab draws now. He would benefit form daily oral Mg.  Although pt is very reluctant to start new medications.   9. Heart murmur: he said he has had this all his life. I do not see any echocardiograms in our system. Apparently, he had one last month at Dr. Maurice Small office. Records were requested, but I don't see this in his paper chart. Will defer to Dr. Wynonia Lawman, his primary cardiologist.   Dispo: plan will be for discharge home today with home health as well as close outpatient follow up with his primary cardiologist, Dr. Wynonia Lawman for BP management and follow up. He has no ride home, so i will have care management come see him to help arrange transportation. He will also need a PCP to have sutures removed from his head lac. Will have care management address this as well.   Signed, Angelena Form PA-C  Pager [phone]

## 2015-11-01 NOTE — Progress Notes (Signed)
Contacted [facility] for scheduled dc home today. Jonnie Finner RN CCM Case Mgmt phone [phone]

## 2015-11-01 NOTE — Progress Notes (Signed)
Pt c/o swelling of his lips. Noted leftt upper and lower lips swollen. Denies SOB nor any pain. No rashes noted. K. Grandville Silos PA notified. Benadryl ordered . V/s monitored.

## 2015-11-01 NOTE — Progress Notes (Signed)
Pt ambulating , expressed wanting to go home . Lips not swollen at this time ,v/s remain stable. Awaiting for CM re.  home med brilinta /insurance benefit/copay.

## 2015-11-01 NOTE — Progress Notes (Signed)
TR BAND REMOVAL  LOCATION:    right radial  DEFLATED PER PROTOCOL:    Yes.    TIME BAND OFF / DRESSING APPLIED:    20:45   SITE UPON ARRIVAL:    Level 1  SITE AFTER BAND REMOVAL:    Level 1  CIRCULATION SENSATION AND MOVEMENT:    Within Normal Limits   Yes.    COMMENTS:  Bruise. No hematoma. Post TR band instructions given. Pt tolerated well.

## 2015-11-01 NOTE — Care Management Important Message (Signed)
Important Message  Patient Details  Name: Joshua Ray MRN: [id number] Date of Birth: [birth year]   Medicare Important Message Given:  Yes    Nathen May 11/01/2015, 11:21 AM

## 2015-11-01 NOTE — Discharge Summary (Signed)
Discharge Summary    Patient ID: Joshua Ray,  MRN: [id number], DOB/AGE: 06/23/1940 75 y.o.  Admit date: 10/30/2015 Discharge date: 11/01/2015  Primary Care Provider: Seward Carol D Primary Cardiologist: Dr. Wynonia Lawman ( per patient request )    Discharge Diagnoses    Principal Problem:   Atrial fibrillation with RVR [facility]) Active Problems:   Coronary atherosclerosis   Type 2 diabetes mellitus ([place])   Tobacco abuse   Hypokalemia   Essential hypertension   Laceration of head   Malnutrition of moderate degree   Atrial fibrillation with rapid ventricular response (HCC)   CAD S/P percutaneous coronary angioplasty   Allergies Allergies  Allergen Reactions  . Ace Inhibitors   . Benazepril Swelling  . Plavix [Clopidogrel Bisulfate]     Lip swelling     History of Present Illness     75 y.o. male with history of CAD (s/p MI and PCI x 3 in 1996 and PCI in 1997), DM, HTN, HLD (patient declined change to higher potency statin), PVD (ABIs 2008 mild diffuse plaque without significant stenosis), ongoing tobacco abuse, heart murmur, remote strokes seen on brain imaging who was admitted with palpitations and found to have rapid AF by EMS. Labs notable for hypokalemia and mildly elevated troponin.   He saw Dr. Oval Linsey 01/2015 at which time the patient preferred to hold off echo to eval murmur since he was asymptomatic. Due to high BP his metoprolol was switched to carvedilol and the patient was unhappy with this change because he said one dose caused him to pass out 10 minutes later. He did not seek care at that time. He was admitted 07/2015 with hypertensive encephalopathy. His PCP ordered an event monitor in 07/2015 which showed predominant NSR with PVCs, no afib. The patient does not know why this was done. He says he's been doing fine ever since his PCI in the 1990s without further chest pain. He is upset that he's had medicine changes in the last year. He is fairly inactive but denies  any exertional anginal or dyspnea. He is not interested in quitting smoking. He denies any further EtOH (per Dr. Blenda Mounts notes in 03/2015, his favorite bar closed down).  He then saw Dr. Wynonia Lawman in the clinic sometime after seeing Dr. Oval Linsey to establish care. He got an echocardiogram at that time. Records are not available.   He was in his usual state of health until around 5:30am on 10/30/15 when he developed onset of rapid HR associated with diaphoresis. He denied any chest pain or SOB at that time. His friend said he had slurred speech during the flurry of activity while being checked out by EMS, but was coherent and alert without any other neuro sx when arrived to ED. When EMS arrived, strips showed AF RVR HR 160s-180s. He was given 10mg  diltiazem without change, then a total of 30mg  of adenosine with transient slowing of afib. He was given another 10mg  of diltiazem and saline bolus with subsequent conversion to NSR.   Labs notable for K 3.4, BNP 144, troponin 0.07, Hgb 12.8, glucose 217.   Hospital Course     Consultants: none  1. Newly diagnosed transient atrial fibrillation- no recurrence after admission. Continues to maintain NSR.   -- CHADSVASC = 6 for HTN, DM, age, vascular disease, and occult strokes identified on imaging. He was initially started on a heparin gtt, with plans to convert him to a NOAC after cath. However, he is very reluctant to start any  new medications and regularly drinks heavily and has had multiple falls in the recent past. I am not sure he is a good candidate for triple therapy with DAPT + NOAC. Will plan for no long term anticoagulation for now.   2. CAD with mildly elevated troponin- inferolateral ST depression on EKG (more pronounced than 08/01/2015 particularly on EMS strips, with hyperacute T's V2-V3).Troponin 0.07>>1.77>>1.21>>0.90.  -- Due to his elevated cardiac enzymes and ECG abnormalities, it was decided to proceed with coronary angiography. Left heart  cath on 11/01/15 showed diffuse multivessel mild disease with 2 areas of roughly 80% stenosis and a subtotally occluded small OM1 branch with successful PCI of the OM 2 lesion and scoring balloon angioplasty (PTCA) of the in-stent restenosis in the RCA. -- Continue BB and Zocor. He has refused to consider higher potency statins in the past. He was placed on DAPT withASA and plavix; however, he developed lip swelling and the only new medication was plavix. It was decided to stop plavix and load with Brilinta 180mg  x1 (on 11/02/15) followed by Brilinta 90mg  BID. The lip swelling did resolve after some Benadryl.  3. Possible slurred speech - Repeat head CT done, no acute abnormalities. Question related to the flurry of activity going on all at once when his HR was fast. No focal neuro abnormalities at present.  4. HTN - Hypertensive with SBP consistently 177-214. On metoprolol 50mg  BID. He previously had a syncopal episode after Dr. Oval Linsey switched him to Coreg 25mg  BID (highly unlikely this was related to syncopal event, but pt refuses Coreg). He reports swelling with amlodipine in the past. Given his hypokalemia, no HCTZ. He has an allergy to ACE-I as well. Therefore, we will increase metop tart to 100mg  BID and start hydralazine 25mg  tid. Later he refused hydralazine due to tongue swelling. So plan will be to just increase BB and defer to outpatient setting.   5. Tobacco abuse - he is not interested in quitting unfortunately.  6. Hypokalemia - 3.4 on 10/30/15. This has been supplemented. He is refusing lab this AM.   7. Head lac from 10/26/15 due to mech fall- if he is still in the hospital at that time, will need suture removal between 6/10-6/13. He can do this with primary care since he is going home today.   8. Hypomagnesemia- Mg of 0.9 on admission. Mg is 1.5 on 10/30/15 after supplementation. Refusing all lab draws now. He would benefit form daily oral Mg. Although pt is very reluctant to start new  medications.   9. Heart murmur- he said he has had this all his life. I do not see any echocardiograms in our system. Apparently, he had one last month at Dr. Maurice Small office. Records were requested, but I don't see this in his paper chart. Will defer to Dr. Wynonia Lawman, his primary cardiologist.   10. DMT2: resume metformin 48 hours after heart cath. HgA1c 7.5. Needs to establish care with PCP ( he fired his old one). Care management to help arrange this.  11. HLD: lipid panel this admission with LDL 81. Goal <70 but patient refuses high intensity statins. Continue Zocor  Dispo: plan will be for discharge home today with home health as well as close outpatient follow up with his primary cardiologist, Dr. Wynonia Lawman for BP management and follow up. He has no ride home, so i will have care management come see him to help arrange transportation. He will also need a PCP to have sutures removed from his head lac.  Will have care management address this as well.   The patient has had an uncomplicated hospital course and is recovering well. The radial catheter site is stable with some slight ecchymosis. He has been seen by Dr. Lucretia Field today and deemed ready for discharge home.  All follow-up appointments have been scheduled; he requests to follow up with Dr. Wynonia Lawman. Smoking cessation was disscussed in length. Discharge medications are listed below.  _____________  Discharge Vitals Blood pressure 189/70, pulse 82, temperature 97.9 F (36.6 C), temperature source Oral, resp. rate 15, height 5\' 10"  (1.778 m), weight 145 lb 8.1 oz (66 kg), SpO2 95 %.  Filed Weights   10/30/15 1655 10/31/15 0500 11/01/15 0429  Weight: 148 lb 4.8 oz (67.268 kg) 146 lb (66.225 kg) 145 lb 8.1 oz (66 kg)    Labs & Radiologic Studies     CBC  Recent Labs  10/30/15 0800 10/30/15 0801 10/31/15 0430  WBC  --  9.8 7.4  NEUTROABS 8.0*  --   --   HGB  --  12.8* 12.3*  HCT  --  37.7* 36.8*  MCV  --  89.3 89.5  PLT  --  296 0000000    Basic Metabolic Panel  Recent Labs  10/30/15 0801 10/30/15 2259 10/31/15 0430  NA 137  --  136  K 3.4*  --  3.4*  CL 101  --  101  CO2 23  --  26  GLUCOSE 217*  --  150*  BUN 10  --  10  CREATININE 0.96  --  0.78  CALCIUM 8.4*  --  8.6*  MG 0.9* 1.7 1.5*   Liver Function Tests  Recent Labs  10/30/15 0800  AST 13*  ALT 7*  ALKPHOS 64  BILITOT 1.1  PROT 6.2*  ALBUMIN 3.2*   No results for input(s): LIPASE, AMYLASE in the last 72 hours. Cardiac Enzymes  Recent Labs  10/30/15 1711 10/30/15 2259 10/31/15 0430  TROPONINI 1.77* 1.21* 0.90*   BNP Invalid input(s): POCBNP D-Dimer No results for input(s): DDIMER in the last 72 hours. Hemoglobin A1C  Recent Labs  10/30/15 1809  HGBA1C 7.5*   Fasting Lipid Panel  Recent Labs  10/31/15 0430  CHOL 141  HDL 45  LDLCALC 81  TRIG 76  CHOLHDL 3.1   Thyroid Function Tests  Recent Labs  10/30/15 1809  TSH 1.414    Dg Chest 2 View  10/30/2015  CLINICAL DATA:  Tachycardia. Weakness. Coronary artery disease and previous myocardial infarct. EXAM: CHEST  2 VIEW COMPARISON:  None. FINDINGS: The heart size and mediastinal contours are within normal limits. Mild atherosclerotic calcification of aortic arch seen. Both lungs are clear. No evidence of pleural effusion pneumothorax. The visualized skeletal structures are unremarkable. IMPRESSION: No active cardiopulmonary disease. Electronically Signed   By: Earle Gell M.D.   On: 10/30/2015 08:47   Ct Head Wo Contrast  10/30/2015  CLINICAL DATA:  75 year old male with trouble speaking this morning. Slurred speech. Recent fall. Initial encounter. EXAM: CT HEAD WITHOUT CONTRAST TECHNIQUE: Contiguous axial images were obtained from the base of the skull through the vertex without intravenous contrast. COMPARISON:  [facility] head CT 10/26/2015, and earlier. FINDINGS: New partially visible fluid level in the right maxillary sinus. No stranding identified in the  visible right orbit. Stable mild mostly posterior ethmoid mucosal thickening and opacification. Visible tympanic cavities and mastoids remain clear. Regressed but not resolved right posterior convexity scalp hematoma. Underlying calvarium intact. No new scalp soft  tissue abnormality. Calcified atherosclerosis at the skull base. No midline shift, mass effect, or evidence of intracranial mass lesion. No acute intracranial hemorrhage identified. No ventriculomegaly. Stable cerebral volume. Stable gray-white matter differentiation throughout the brain. Mild for age scattered cerebral white matter hypodensity. No cortically based acute infarct identified. No suspicious intracranial vascular hyperdensity. IMPRESSION: 1.  Stable non contrast CT appearance of the brain. 2. Regressed but not resolved right scalp hematoma without underlying fracture. 3. New but partially visible fluid level in the right maxillary sinus, could be inflammatory or posttraumatic. Electronically Signed   By: Genevie Ann M.D.   On: 10/30/2015 13:44   Ct Head Wo Contrast  10/26/2015  CLINICAL DATA:  75 year old male with acute head injury following fall today. Initial encounter. EXAM: CT HEAD WITHOUT CONTRAST TECHNIQUE: Contiguous axial images were obtained from the base of the skull through the vertex without intravenous contrast. COMPARISON:  08/08/2015 FINDINGS: Mild atrophy and chronic small-vessel white matter ischemic changes again noted. Remote basal ganglia and right splenic infarcts again identified. No acute intracranial abnormalities are identified, including mass lesion or mass effect, hydrocephalus, extra-axial fluid collection, midline shift, hemorrhage, or acute infarction. The visualized bony calvarium is unremarkable. Posterior right scalp soft tissue swelling/ hematoma identified. IMPRESSION: No evidence of acute intracranial abnormality. Posterior right scalp soft tissue swelling without fracture. Atrophy, chronic small-vessel white  matter ischemic changes and remote infarcts as described. Electronically Signed   By: Margarette Canada M.D.   On: 10/26/2015 20:46     Diagnostic Studies/Procedures    10/31/15 Procedures    Coronary Stent Intervention   Left Heart Cath and Coronary Angiography    Conclusion    1. 2nd Mrg-2 lesion, 80% stenosed. Post intervention - Synergy DES 2.25 mm x 20 mm (2.4 mm), there is a 0% residual stenosis. 2. 1st Mrg lesion, 99% stenosed. Very small caliber subtotal vessel. 2 small for PCI 3. Prox RCA to Mid RCA lesion, 75% stenosed - in-stent restenosis. Post scoring balloon angioplasty (PTCA) only intervention, there is a 20% residual stenosis. The lesion was previously treated with several bare-metal stents in the 1990s.). 4. 2nd RPLB lesion, 50% stenosed. 5. There is mild left ventricular systolic dysfunction.  Patient has diffuse multivessel mild disease, with 2 areas of roughly 80% stenosis and a subtotally occluded small OM1 branch. Successful PCI of the OM 2 lesion and scoring balloon angioplasty (PTCA) of the in-stent restenosis in the RCA.  Plan:  Transport patient to post procedure unit for ongoing care.  Aspirin plus Plavix for minimum of 1 month, then can stop aspirin as the patient will likely be on anticoagulation for his atrial fibrillation.  Continue aggressive risk factor modification.  PRN IV hydralazine ordered for hypertension         _____________    Disposition   Pt is being discharged home today in good condition.  Follow-up Plans & Appointments    Follow-up Information    Follow up with Ezzard Standing, MD On 11/08/2015.   Specialty:  Cardiology   Why:  @ 9:15 am   Contact information:   [address] [place] [address] [phone]       Follow up with Kandice Hams, MD.   Specialty:  Internal Medicine   Why:  Please follow up with the PCP of your choice to follow your general health and remove sutures. These  should be removed between 6/10-6/13   Contact information:   [address]  [place] [address] [phone]       Follow up with [facility].   Why:  Home Health RN, Occupational Therapy and Physical Therapy   Contact information:   [address] [place] [address] [phone]      Discharge Instructions    Amb Referral to Cardiac Rehabilitation    Complete by:  As directed   Diagnosis:   NSTEMI Comment - don not call as pt not interested Coronary Stents             Discharge Medications   Current Discharge Medication List    START taking these medications   Details  ticagrelor (BRILINTA) 90 MG TABS tablet Take 1 tablet (90 mg total) by mouth 2 (two) times daily. Qty: 180 tablet, Refills: 6      CONTINUE these medications which have NOT CHANGED   Details  aspirin 81 MG tablet Take 81 mg by mouth daily. Reported on 05/08/2015   Associated Diagnoses: Atherosclerosis of native coronary artery of native heart without angina pectoris    cholecalciferol (VITAMIN D) 1000 UNITS tablet Take 1,000 Units by mouth daily.    Cyanocobalamin (B-12 PO) Take 1 tablet by mouth daily.    glipiZIDE (GLUCOTROL) 10 MG tablet Take 1 tablet (10 mg total) by mouth daily before breakfast. Qty: 30 tablet, Refills: 0    metFORMIN (GLUCOPHAGE) 1000 MG tablet TAKE 1 TABLET (1,000 MG TOTAL) BY MOUTH 2 (TWO) TIMES DAILY WITH MEAL Qty: 60 tablet, Refills: 11   Associated Diagnoses: Controlled type 2 diabetes mellitus without complication, without long-term current use of insulin (HCC)    metoprolol succinate (TOPROL-XL) 50 MG 24 hr tablet Take 1 tablet (50 mg total) by mouth daily. Take with or immediately following a meal. Qty: 30 tablet, Refills: 11   Associated Diagnoses: Essential hypertension    montelukast (SINGULAIR) 10 MG tablet TAKE 1 TABLET (10 MG TOTAL) BY MOUTH DAILY AT BEDTIME. Qty: 90 tablet, Refills: 2    nitroGLYCERIN (NITROSTAT)  0.4 MG SL tablet PLACE 1 TABLET (0.4 MG TOTAL) UNDER THE TONGUE EVERY 5 (FIVE) MINUTES AS NEEDED. Qty: 25 tablet, Refills: 0    potassium chloride (K-DUR) 10 MEQ tablet Take 20 mEq by mouth daily.     simvastatin (ZOCOR) 20 MG tablet Take 1 tablet (20 mg total) by mouth daily at betime Qty: 30 tablet, Refills: 11   Associated Diagnoses: Hyperlipidemia         Aspirin prescribed at discharge?  Yes High Intensity Statin Prescribed? (Lipitor 40-80mg  or Crestor 20-40mg ): No: intolerant Beta Blocker Prescribed? Yes For EF 45% or less, Was ACEI/ARB Prescribed? No: intolerant to ACE. ADP Receptor Inhibitor Prescribed? (i.e. Plavix etc.-Includes Medically Managed Patients): Yes For EF <45%, Aldosterone Inhibitor Prescribed? No Was EF assessed during THIS hospitalization? No: YES Was Cardiac Rehab II ordered? (Included Medically managed Patients): Yes   Outstanding Labs/Studies   BMET and Mag to check electrolytes  Duration of Discharge Encounter   Greater than 30 minutes including physician time.  Signed, Angelena Form PA-C 11/01/2015, 2:13 PM

## 2015-11-01 NOTE — Progress Notes (Signed)
CARDIAC REHAB PHASE I   PRE:  Rate/Rhythm: 96 SR  BP:  Supine: 197/83  Sitting:   Standing:    SaO2: 97%RA  MODE:  Ambulation: 460 ft   POST:  Rate/Rhythm: 134 ST  BP:  Supine: 207/91  Sitting:   Standing:    SaO2: 97%RA 0900-0950 Pt walked 460 ft on RA with hand held asst with fairly steady gait. Stopped a couple of times to rest. 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 Stressed importance of plavix with stent. Pt not interested in quitting smoking and stated he will eat what he wants. Encouraged walking as tolerated. Will refer to [facility] Phase 2 but pt does not want to attend. Dr Oval Linsey in to see pt and aware of elevated HR and BP. Tried to explain to pt that BP needs to be better controlled before discharge. Will need to see case manager re a way home.    Graylon Good, RN BSN  11/01/2015 9:45 AM

## 2015-11-01 NOTE — Clinical Social Work Note (Addendum)
At nurse's request, patient provided with cab voucher to get home, as he is ready for discharge today.  Later same date, cab voucher returned to [place] as patient will be transported home by family (brother).   Virginio Isidore Givens, MSW, LCSW Licensed Clinical Social Worker [place] [phone]

## 2015-11-01 NOTE — Discharge Instructions (Signed)

## 2015-12-06 ENCOUNTER — Other Ambulatory Visit: Payer: Self-pay

## 2015-12-06 DIAGNOSIS — I10 Essential (primary) hypertension: Secondary | ICD-10-CM

## 2015-12-06 MED ORDER — METOPROLOL SUCCINATE ER 50 MG PO TB24: 50.0000 mg | ORAL_TABLET | Freq: Every day | ORAL | Status: AC

## 2015-12-25 ENCOUNTER — Ambulatory Visit (INDEPENDENT_AMBULATORY_CARE_PROVIDER_SITE_OTHER): Payer: Medicare Other | Admitting: Allergy and Immunology

## 2015-12-25 ENCOUNTER — Encounter: Payer: Self-pay | Admitting: Allergy and Immunology

## 2015-12-25 VITALS — BP 168/102 | HR 80 | Temp 98.1°F | Resp 20 | Ht 69.29 in | Wt 145.2 lb

## 2015-12-25 DIAGNOSIS — Z720 Tobacco use: Secondary | ICD-10-CM | POA: Diagnosis not present

## 2015-12-25 DIAGNOSIS — T783XXA Angioneurotic edema, initial encounter: Secondary | ICD-10-CM

## 2015-12-25 DIAGNOSIS — J3089 Other allergic rhinitis: Secondary | ICD-10-CM

## 2015-12-25 NOTE — Patient Instructions (Signed)
  1. Remain away from amlodipine use  2. Check blood pressure and establish if metoprolol is adequate to control blood pressure. May require a additional antihypertensive medication  3. Use OTC Rhinocort one spray each nostril 3-7 times a week. Sample. Coupon.  4. Continue Singulair 10 mg one tablet one time per day  5. Contact clinic if treatment ineffective.  6. Obtain fall flu vaccine  7. Always attempt to find nicotine substitutes to replace tobacco smoke exposure

## 2015-12-25 NOTE — Progress Notes (Signed)
NEW PATIENT NOTE  Referring Provider: No ref. provider found Primary Provider: Kandice Hams, MD Date of office visit: 12/25/2015    Subjective:   Chief Complaint:  Joshua Ray (DOB: Nov 08, 1940) is a 75 y.o. male with a chief complaint of Angioedema (Facial and tongue swelling.)  who presents to the clinic on 12/25/2015 with the following problems:  HPI: Praneel presents to this clinic in evaluation of 2 issues.  First, he has had an issue with amlodipine administration. He had placement of stents 10/31/2015 and was started on amlodipine. He has developed facial swelling and leg swelling when using this medication. He discontinued his amlodipine over the course of the past 2 weeks and all the swelling reaction has resolved.  Second, he has issues with nasal congestion and occasional sneezing and runny nose and intermittent issues with his ears filling up and not hearing as well as he usually does. I actually saw him in this clinic back in 2013 for similar type of complaint for which we started him on Singulair which works pretty well as long as he does use this medication. Unfortunately, he is not persistent and consistent about using this medication. In addition, he has tried nasal steroids in the past which were helpful as well.  He continues to smoke extensively and has done so for many decades. He does have cough on occasion and some shortness of breath but this is not an issue that he particularly wants addressed at this point.  Past Medical History:  Diagnosis Date  . CAD (coronary artery disease)    a. s/p MI and PCI x 3 in 1996 and PCI in 1997.  b. NSTEMI 11/01/15 showed diffuse multivessel mild disease with 2 areas of roughly 80% stenosis and a subtotally occluded small OM1 branch with successful PCI of the OM 2 lesion and scoring balloon angioplasty (PTCA) of the in-stent restenosis in the RCA.  . Cataract   . Diabetes mellitus   . Heart murmur   . Hemorrhoids   .  Hyperlipidemia    a. patient declined change to higher potency statin.  Marland Kitchen Hyperlipidemia 02/11/2015  . Hypertension   . Prostate cancer ([place])   . PVC's (premature ventricular contractions)    a. By event monitor 07/2015.  Marland Kitchen PVD (peripheral vascular disease) ([place])    a. ABIs 2008 mild diffuse plaque without significant stenosis.  . Stroke (cerebrum) ([place])    a. CT head 10/2015 - Remote basal ganglia and right splenic infarcts again identified.  . Tobacco abuse     Past Surgical History:  Procedure Laterality Date  . CARDIAC CATHETERIZATION N/A 10/31/2015   Procedure: Left Heart Cath and Coronary Angiography;  Surgeon: Leonie Man, MD;  Location: [facility];  Service: Cardiovascular;  Laterality: N/A;  . CARDIAC CATHETERIZATION N/A 10/31/2015   Procedure: Coronary Stent Intervention;  Surgeon: Leonie Man, MD;  Location: [facility];  Service: Cardiovascular;  Laterality: N/A;  . EYE SURGERY        Medication List      aspirin 81 MG tablet Take 81 mg by mouth daily. Reported on 05/08/2015   atorvastatin 40 MG tablet Commonly known as:  LIPITOR   B-12 PO Take 1 tablet by mouth daily.   cholecalciferol 1000 units tablet Commonly known as:  VITAMIN D Take 1,000 Units by mouth daily.   glipiZIDE 5 MG tablet Commonly known as:  GLUCOTROL Take 5 mg by mouth every morning.  metFORMIN 1000 MG tablet Commonly known as:  GLUCOPHAGE TAKE 1 TABLET (1,000 MG TOTAL) BY MOUTH 2 (TWO) TIMES DAILY WITH MEAL   metoprolol succinate 50 MG 24 hr tablet Commonly known as:  TOPROL-XL Take 1 tablet (50 mg total) by mouth daily. Take with or immediately following a meal.   montelukast 10 MG tablet Commonly known as:  SINGULAIR TAKE 1 TABLET (10 MG TOTAL) BY MOUTH DAILY AT BEDTIME.   nitroGLYCERIN 0.4 MG SL tablet Commonly known as:  NITROSTAT PLACE 1 TABLET (0.4 MG TOTAL) UNDER THE TONGUE EVERY 5 (FIVE) MINUTES AS NEEDED.   potassium chloride 10 MEQ tablet Commonly  known as:  K-DUR Take 20 mEq by mouth daily.   simvastatin 20 MG tablet Commonly known as:  ZOCOR Take 1 tablet (20 mg total) by mouth daily at betime   spironolactone 25 MG tablet Commonly known as:  ALDACTONE Take 25 mg by mouth daily.   ticagrelor 90 MG Tabs tablet Commonly known as:  BRILINTA Take 1 tablet (90 mg total) by mouth 2 (two) times daily.       Allergies  Allergen Reactions  . Ace Inhibitors   . Atorvastatin Swelling  . Benazepril Swelling  . Plavix [Clopidogrel Bisulfate]     Lip swelling    Review of systems negative except as noted in HPI / PMHx or noted below:  Review of Systems  Constitutional: Negative.   HENT: Negative.   Eyes: Negative.   Respiratory: Negative.   Cardiovascular: Negative.   Gastrointestinal: Negative.   Genitourinary: Negative.   Musculoskeletal: Negative.   Skin: Negative.   Neurological: Negative.   Endo/Heme/Allergies: Negative.   Psychiatric/Behavioral: Negative.     Family History  Problem Relation Age of Onset  . Cancer Mother   . Heart disease Father     Social History   Social History  . Marital status: Single    Spouse name: N/A  . Number of children: N/A  . Years of education: N/A   Occupational History  . vendor Retired   Social History Main Topics  . Smoking status: Current Every Day Smoker    Packs/day: 0.50    Years: 30.00    Types: Cigarettes  . Smokeless tobacco: Never Used     Comment: Patient has no desire to quit  . Alcohol use 0.0 oz/week     Comment: beer previously - as of 10/2015, denies etoh  . Drug use: No  . Sexual activity: Not on file   Other Topics Concern  . Not on file   Social History Narrative   Exercise walking    Environmental and Social history  Lives in a house with a dry environment, a dog located inside the household, would in the bedroom floor, plastic on both the bed and pillow, actually smoking tobacco products.   Objective:   Vitals:   12/25/15 0856    BP: (!) 168/102  Pulse: 80  Resp: 20  Temp: 98.1 F (36.7 C)   Height: 5' 9.29" (176 cm) Weight: 145 lb 3.2 oz (65.9 kg)  Physical Exam  Constitutional: He is well-developed, well-nourished, and in no distress.  HENT:  Head: Normocephalic. Head is without right periorbital erythema and without left periorbital erythema.  Right Ear: Tympanic membrane, external ear and ear canal normal.  Left Ear: Tympanic membrane, external ear and ear canal normal.  Nose: Mucosal edema (Very significant mucosal swelling left nasal airway with deviated septum right to left) present. No rhinorrhea.  Mouth/Throat: Oropharynx is  clear and moist and mucous membranes are normal. No oropharyngeal exudate.  Eyes: Conjunctivae and lids are normal. Pupils are equal, round, and reactive to light.  Neck: Trachea normal. No tracheal deviation present. No thyromegaly present.  Cardiovascular: Normal rate, regular rhythm, S1 normal, S2 normal and normal heart sounds.   No murmur heard. Pulmonary/Chest: Effort normal. No stridor. No tachypnea. No respiratory distress. He has no wheezes. He has no rales. He exhibits no tenderness.  Abdominal: Soft. He exhibits no distension and no mass. There is no hepatosplenomegaly. There is no tenderness. There is no rebound and no guarding.  Musculoskeletal: He exhibits no edema or tenderness.  Lymphadenopathy:       Head (right side): No tonsillar adenopathy present.       Head (left side): No tonsillar adenopathy present.    He has no cervical adenopathy.    He has no axillary adenopathy.  Neurological: He is alert. Gait normal.  Skin: No rash noted. He is not diaphoretic. No erythema. No pallor. Nails show no clubbing.  Psychiatric: Mood and affect normal.     Diagnostics:  None   Assessment and Plan:    1. Other allergic rhinitis   2. Angioedema, initial encounter   3. Tobacco use     1. Remain away from amlodipine use  2. Check blood pressure and establish  if metoprolol is adequate to control blood pressure. May require a additional antihypertensive medication  3. Use OTC Rhinocort one spray each nostril 3-7 times a week. Sample. Coupon.  4. Continue Singulair 10 mg one tablet one time per day  5. Contact clinic if treatment ineffective.  6. Obtain fall flu vaccine  7. Always attempt to find nicotine substitutes to replace tobacco smoke exposure  I think that Kazuki has performed a very good experiment regarding the cause of his facial and lower extremity angioedema and it does appear that it is amlodipine as the responsible agent for this issue. He should not have any amlodipine in the future and he may need to find a substitute antihypertensive medication to address this his hypertension. I will leave this up to his primary care doctor to work through this issue with him. For his mucosal inflammation it would be best for him to consistently use Singulair as this has given him pretty good results in the past and he has the option of using some nasal steroids. Of course, I did have a talk with him today about the need to find a substitute for his tobacco smoke exposure. I also had a discussion with him about this issue about 4 years ago but it does not appear as though I've had much influence in altering his hobby. He will follow-up in this clinic as needed.  Allena Katz, MD [place]

## 2016-03-31 ENCOUNTER — Other Ambulatory Visit: Payer: Self-pay | Admitting: Internal Medicine

## 2016-03-31 DIAGNOSIS — I739 Peripheral vascular disease, unspecified: Secondary | ICD-10-CM

## 2016-04-06 ENCOUNTER — Other Ambulatory Visit: Payer: Medicare Other

## 2016-04-24 ENCOUNTER — Other Ambulatory Visit: Payer: Medicare Other

## 2016-05-21 ENCOUNTER — Other Ambulatory Visit: Payer: Self-pay | Admitting: Family Medicine

## 2016-05-22 ENCOUNTER — Other Ambulatory Visit: Payer: Self-pay | Admitting: Emergency Medicine

## 2016-05-31 ENCOUNTER — Emergency Department (HOSPITAL_COMMUNITY)
Admission: EM | Admit: 2016-05-31 | Discharge: 2016-05-31 | Discharge status: Absent without leave | Payer: Medicare Other

## 2016-06-02 ENCOUNTER — Other Ambulatory Visit: Payer: Self-pay | Admitting: Internal Medicine

## 2016-06-02 DIAGNOSIS — R55 Syncope and collapse: Secondary | ICD-10-CM

## 2016-06-04 ENCOUNTER — Encounter: Payer: Self-pay | Admitting: Cardiology

## 2016-06-04 ENCOUNTER — Ambulatory Visit
Admission: RE | Admit: 2016-06-04 | Discharge: 2016-06-04 | Disposition: A | Discharge status: Home / Self Care | Payer: Medicare Other | Source: Ambulatory Visit | Attending: Internal Medicine | Admitting: Internal Medicine

## 2016-06-04 DIAGNOSIS — R55 Syncope and collapse: Secondary | ICD-10-CM

## 2016-06-04 NOTE — Progress Notes (Signed)
Joshua Ray    Date of visit:  06/04/2016 DOB:  03/07/1941    Age:  76 yrs. Medical record number:  [id number]     Account number:  [id number] Primary Care Provider: POLITE MD, RONALD ____________________________ CURRENT DIAGNOSES  1. Paroxysmal atrial fibrillation  2. Syncope and collapse  3. CAD Native without angina  4. Presence of coronary stent  5. Hypertensive heart disease without heart failure  6. Hyperlipidemia  7. Personal History Of Malignant Neoplasm Of Prostate ____________________________ ALLERGIES  Ace Inhibitors, Facial swelling  Atorvastatin, Facial swelling  Benazepril, Swelling (localized)  Plavix, Swelling-mouth ____________________________ MEDICATIONS  1. aspirin 81 mg tablet,delayed release, 1 p.o. daily  2. potassium chloride ER 10 mEq capsule,extended release, 2 p.o. daily  3. spironolactone 25 mg tablet, 1 p.o. daily  4. cholecalciferol (vitamin D3) 1,000 unit tablet, 1 p.o. daily  5. cyanocobalamin (vit B-12) 1,000 mcg tablet, 1 p.o. daily  6. nitroglycerin 0.4 mg sublingual tablet, Take as directed  7. metoprolol succinate ER 50 mg tablet,extended release 24 hr, 1 by mouth BID  8. glipizide 10 mg tablet, 1/2 tablet p.o. daily  9. metformin 500 mg tablet, 2 p.o. b.i.d.  10. simvastatin 40 mg tablet, 1 p.o. daily  11. montelukast 10 mg tablet, PRN  12. krill oil 500 mg capsule, QHS ____________________________ HISTORY OF PRESENT ILLNESS The patient is seen again at the request of Dr. Delfina Redwood. The patient has been having recurrent episodes of falling. He has been a very difficult historian and has not kept his most recent appointments at this office and compliance has been an issue. He describes episodes where his arms were shaking and jerking and then he will fall to the ground but states that he does not lose consciousness. He has had some episodes of angioedema that he has been to the emergency room with and has had issues this summer with some atrial  fibrillation as well as stenting of a few vessels. It has been difficult to get him to take his medications. He is not currently having angina. His most previous episode occurred when he drove his car back and he was getting out of his car he stated his arms jerked and he fell to the driveway. He has a scab on his left hand and he was seen at Dr. Lina Sar office and again asked to return here. He was supposed to have been seen in November however, was canceled and never rescheduled. He has seen an allergist and he will not take a number of medications because of the fact that he thinks that causes him to have angioedema. We felt in the past this has been mostly due to ACE inhibitors. He clearly describes his arms would jerk and then he will fall to the ground but clearly denies that he loses consciousness. He does not describe generalized seizure activity that I am aware of. He denies recurrent angina and has no PND orthopnea or claudication. He was late for his appointment today and stated that he needed to be out by 4:00 in order to have a CT scan. Evidently he has not had an EEG. An EKG done in Dr. Lina Sar office shows sinus rhythm. ____________________________ PAST HISTORY  Past Medical Illnesses:  hypertension, DM-non-insulin dependent, hyperlipidemia, prostate cancer treated with external beam radiation;  Cardiovascular Illnesses:  S/P MI-inferior, CAD, atrial fibrillation-paroxysmal;  Surgical Procedures:  eye surgery, amputation right index finger;  NYHA Classification:  I;  Canadian Angina Classification:  Class 0: Asymptomatic;  Cardiology Procedures-Invasive:  Palmaz Daneil Dolin stent  RCA 03/1195 [facility] and second to RCA Dr. Lia Foyer 11/25/95, cardiac cath (left) June 2017, 2.25 x 20 Synergy stent in 2nd OM 10/30/15, PTCA of ISR of RCA 10/30/15;  Cardiology Procedures-Noninvasive:  echocardiogram April 2017;  Cardiac Cath Results:  normal Left main, scattered irregularities LAD, subtotal occlusion OM 1, 80%  stenosis proximal OM 2, 70% stenosis proximal RCA;  LVEF of 50% documented via echocardiogram on 09/17/2015,   ____________________________ CARDIO-PULMONARY TEST DATES EKG Date:  11/08/2015;   Cardiac Cath Date:  10/30/2015;  Stent Placement Date: 10/29/2015;  Echocardiography Date: 09/17/2015;  Chest Xray Date: 08/08/2015;   ____________________________ FAMILY HISTORY Father -- Father dead, Myocardial infarction at less than 44 Mother -- Mother dead, Death due to natural cause ____________________________ SOCIAL HISTORY Alcohol Use:  no alcohol use;  Smoking:  smokes less than 1 ppd, 50 pack year history;  Diet:  regular diet;  Lifestyle:  divorced and 1 son;  Exercise:  some exercise;  Occupation:  retired traveling Hotel manager;  Residence:  lives alone;   ____________________________ [place]:  denies recent weight change, fatique or change in exercise tolerance. Eyes: wears eye glasses/contact lenses, cataract extraction bilaterally, denies diplopia, glaucoma or visual field defects. Respiratory: mild dyspnea with exertion Cardiovascular:  please review HPI Abdominal: anorexia Genitourinary-Male: erectile dysfunction  Musculoskeletal:  denies arthritis, venous insufficiency, or muscle weakness Neurological:  shaking  ____________________________ PHYSICAL EXAMINATION VITAL SIGNS  Blood Pressure:  168/104 Sitting, Right arm, regular cuff  , 150/100 Standing, Right arm and regular cuff   Pulse:  76/min. Weight:  143.00 lbs. Height:  70"BMI: 20  Constitutional:  pleasant white male in no acute distress Skin:  warm and dry to touch, no apparent skin lesions, or masses noted. Head:  normocephalic, normal hair pattern, no masses or tenderness Eyes:  EOMS Intact, PERRLA, C and S clear, Funduscopic exam not done. ENT:  ears, nose and throat reveal no gross abnormalities.  Dentition good. Neck:  supple, without massess. No JVD, thyromegaly or carotid bruits. Carotid upstroke  normal. Chest:  normal symmetry, clear to auscultation. Abdomen:  abdomen soft,non-tender, no masses, no hepatospenomegaly, or aneurysm noted Peripheral Pulses:  the femoral,dorsalis pedis, and posterior tibial pulses are full and equal bilaterally with no bruits auscultated. Extremities & Back:  no spinal abnormalities noted., normal muscle strength and tone., 1+ edema Neurological:  no gross motor or sensory deficits noted, affect appropriate, oriented x3. ____________________________ MOST RECENT LIPID PANEL 10/31/15  CHOL TOTL 141 mg/dl, LDL 81 NM, HDL 45 mg/dl, TRIGLYCER 76 mg/dl and CHOL/HDL 3.1 (Calc) ____________________________ IMPRESSIONS/PLAN  1. Recurrent episodes of falling spells. It is hard from the history to determine if this is true syncope or not. He did have an episode earlier this year of rapid atrial fibrillation and underwent stenting during that admission. 2. Coronary artery disease with stenting previously 2. History of atrial fibrillation currently not anticoagulated and currently in sinus rhythm 4. Tobacco abuse ongoing 5. Hypertension poorly controlled not currently orthostatic  Recommendations:  Very difficult to assess. He has come here several times in the history has always been difficult. I don't think that an event monitor will help determine the etiology as the spells are embedded. About the only way that we'll be able to determine whether this is arrhythmic or not would be to consider an implantable monitor. I have asked him to have a consultation with electrophysiology determine if he would be a candidate for  an implantable loop monitor in  light of his complex history. Followup afterwards.  Greater than 40 minutes spent with patient including greater than 50% of time face to face with coordination, also with referrring MD and EP.   ____________________________ TODAYS ORDERS  1. Return Visit: 2 months  2.  Consult EP: ASAP                        ____________________________ Cardiology Physician:  Kerry Hough MD [facility]

## 2016-06-05 ENCOUNTER — Other Ambulatory Visit: Payer: Self-pay | Admitting: Internal Medicine

## 2016-06-05 ENCOUNTER — Other Ambulatory Visit (HOSPITAL_COMMUNITY): Payer: Self-pay | Admitting: Respiratory Therapy

## 2016-06-05 DIAGNOSIS — W19XXXA Unspecified fall, initial encounter: Secondary | ICD-10-CM

## 2016-06-05 DIAGNOSIS — R55 Syncope and collapse: Secondary | ICD-10-CM

## 2016-06-19 ENCOUNTER — Ambulatory Visit (HOSPITAL_COMMUNITY)
Admission: RE | Admit: 2016-06-19 | Discharge: 2016-06-19 | Disposition: A | Discharge status: Home / Self Care | Payer: Medicare Other | Source: Ambulatory Visit | Attending: Internal Medicine | Admitting: Internal Medicine

## 2016-06-19 DIAGNOSIS — R55 Syncope and collapse: Secondary | ICD-10-CM | POA: Diagnosis present

## 2016-06-19 DIAGNOSIS — W19XXXA Unspecified fall, initial encounter: Secondary | ICD-10-CM | POA: Insufficient documentation

## 2016-06-19 DIAGNOSIS — R569 Unspecified convulsions: Secondary | ICD-10-CM

## 2016-06-19 NOTE — Procedures (Signed)
ELECTROENCEPHALOGRAM REPORT  Date of Study: 06/19/2016  Patient's Name: Joshua Ray MRN: [id number] Date of Birth: 1941-01-31  Referring Provider: Dr. Seward Carol  Clinical History: This is a 76 year old man with recurrent episodes of falling, described as shaking and jerking with no loss of consciousness.  Medications: amLODipine (NORVASC) 5 MG tablet aspirin 81 MG tablet glipiZIDE (GLUCOTROL) 5 MG tablet metFORMIN (GLUCOPHAGE) 1000 MG tablet  metoprolol succinate (TOPROL-XL) 50 MG 24 hr tablet montelukast (SINGULAIR) 10 MG tablet potassium chloride (K-DUR) 10 MEQ tablet  simvastatin (ZOCOR) 20 MG tablet  spironolactone (ALDACTONE) 25 MG tablet ticagrelor (BRILINTA) 90 MG TABS tablet   Technical Summary: A multichannel digital EEG recording measured by the international 10-20 system with electrodes applied with paste and impedances below 5000 ohms performed in our laboratory with EKG monitoring in an awake and asleep patient.  Hyperventilation was not performed. Photic stimulation was performed.  The digital EEG was referentially recorded, reformatted, and digitally filtered in a variety of bipolar and referential montages for optimal display.    Description: The patient is awake and asleep during the recording.  During maximal wakefulness, there is a symmetric, medium voltage 9.5 Hz posterior dominant rhythm that attenuates with eye opening.  The record is symmetric.  During drowsiness and sleep, there is an increase in theta slowing of the background, with shifting asymmetry over the bilateral temporal regions, at times sharply contoured over the left temporal region without clear epileptogenic potential.  Vertex waves and symmetric sleep spindles were seen.  Photic stimulation did not elicit any abnormalities.  There were no epileptiform discharges or electrographic seizures seen.    EKG lead showed frequent extrasystolic beats.  Impression: This awake and asleep EEG is  within normal limits for age.  Clinical Correlation: A normal EEG does not exclude a clinical diagnosis of epilepsy. Clinical correlation is advised.   Ellouise Newer, M.D.

## 2016-06-19 NOTE — Progress Notes (Signed)
EEG completed; results pending.    

## 2016-07-03 ENCOUNTER — Encounter: Payer: Self-pay | Admitting: Internal Medicine

## 2016-07-03 ENCOUNTER — Ambulatory Visit (INDEPENDENT_AMBULATORY_CARE_PROVIDER_SITE_OTHER): Payer: Medicare Other | Admitting: Internal Medicine

## 2016-07-03 VITALS — BP 140/90 | HR 89 | Ht 70.0 in | Wt 140.0 lb

## 2016-07-03 DIAGNOSIS — I480 Paroxysmal atrial fibrillation: Secondary | ICD-10-CM

## 2016-07-03 DIAGNOSIS — R55 Syncope and collapse: Secondary | ICD-10-CM

## 2016-07-03 NOTE — Patient Instructions (Signed)
Medication Instructions: - Your physician recommends that you continue on your current medications as directed. Please refer to the Current Medication list given to you today.  Labwork: - none ordered  Procedures/Testing: - none ordered  Follow-Up: - pending Dr. Olin Pia discussion with Dr. Wynonia Lawman.  Any Additional Special Instructions Will Be Listed Below (If Applicable).     If you need a refill on your cardiac medications before your next appointment, please call your pharmacy.  s
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
## 2016-12-06 IMAGING — MR MR HEAD W/O CM
9 of 10 series · 36 of 48 positions shown · non-contrast
Comparison: Prior CT from earlier the same day.

CLINICAL DATA: Initial evaluation for syncope.

EXAM:
MRI HEAD WITHOUT CONTRAST
TECHNIQUE: Multiplanar, multiecho pulse sequences of the brain and surrounding
structures were obtained without intravenous contrast.

[Series 3: T1 · sagittal · 5.0mm · 0.47mm/px · 1 of 25 slices shown]
[im 1/25]
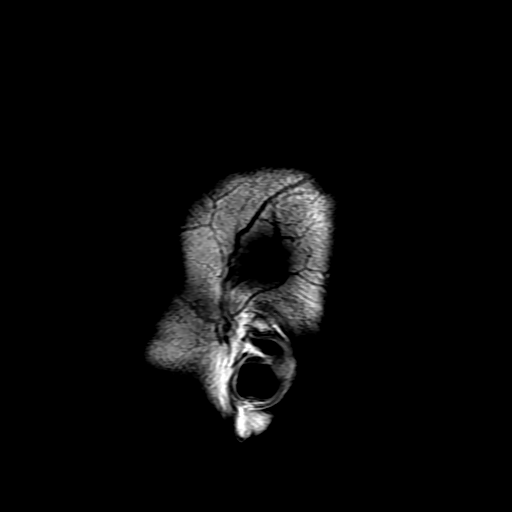

[Series 4: DWI · axial · 3.0mm · 1.09mm/px · z∈[-106,+40]mm · 9 of 100 slices shown (1 of 4)]
[im 1/100]
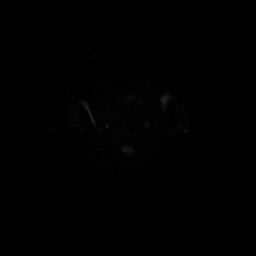
[im 13/100]
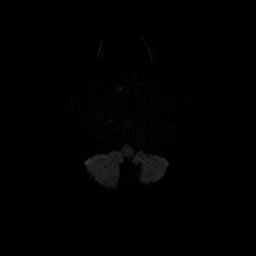
[im 25/100]
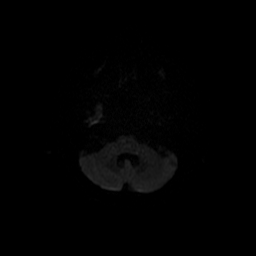
[im 38/100]
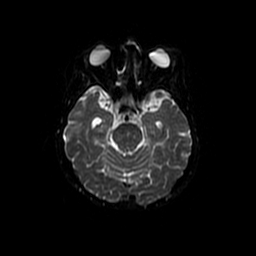
[im 50/100]
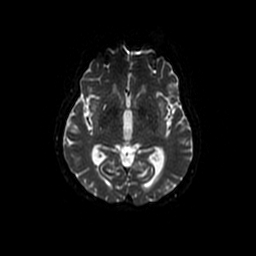
[im 62/100]
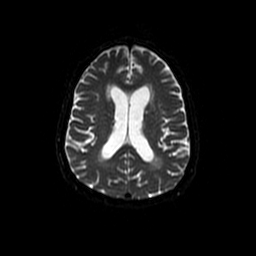
[im 75/100]
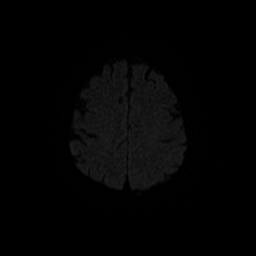
[im 87/100]
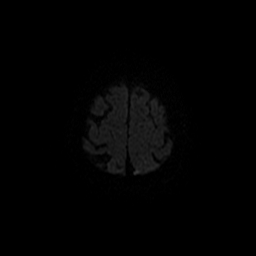
[im 100/100]
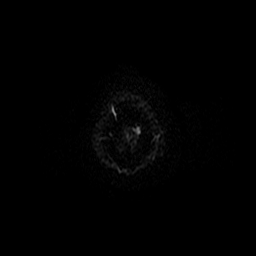

[Series 5: T2 · axial · 5.0mm · 0.43mm/px · z∈[-115,+34]mm · 3 of 26 slices shown (1 of 2)]
[im 1/26]
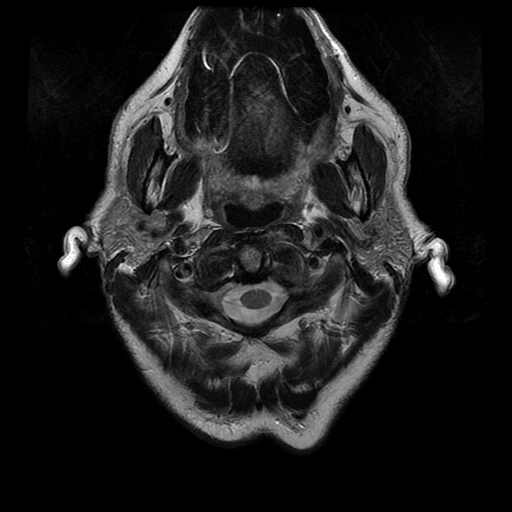
[im 13/26]
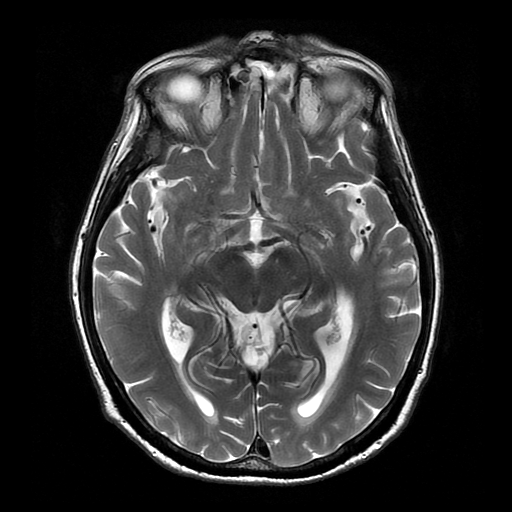
[im 26/26]
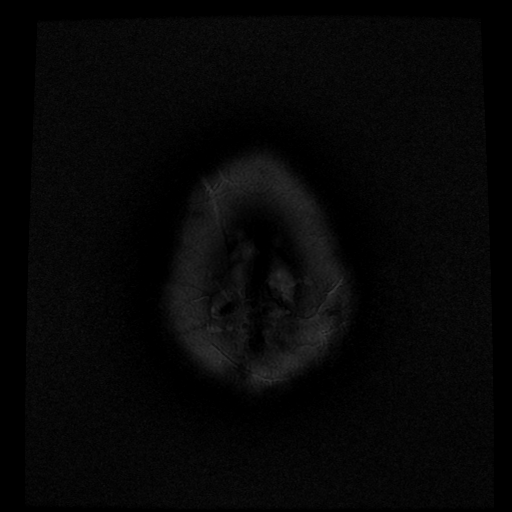

[Series 6: DWI · coronal · 5.0mm · 1.09mm/px · 7 of 72 slices shown (2 of 4)]
[im 1/72]
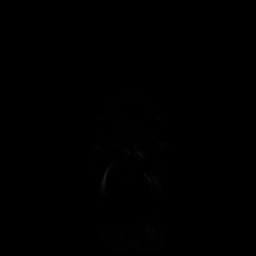
[im 12/72]
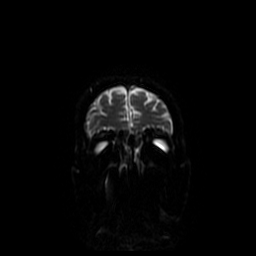
[im 24/72]
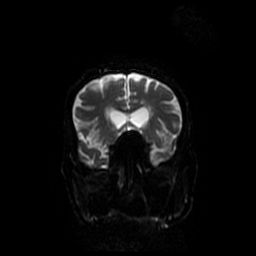
[im 36/72]
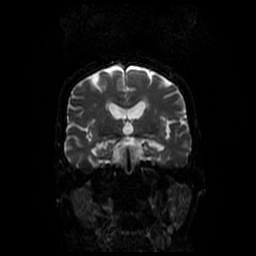
[im 48/72]
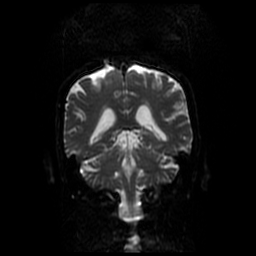
[im 60/72]
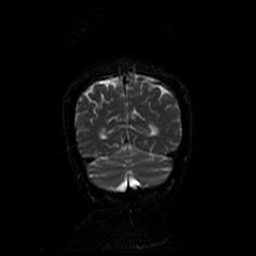
[im 72/72]
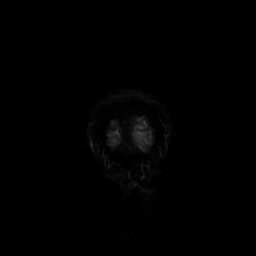

[Series 7: FLAIR · axial · 5.0mm · 0.43mm/px · z∈[-115,+34]mm · 3 of 26 slices shown]
[im 1/26]
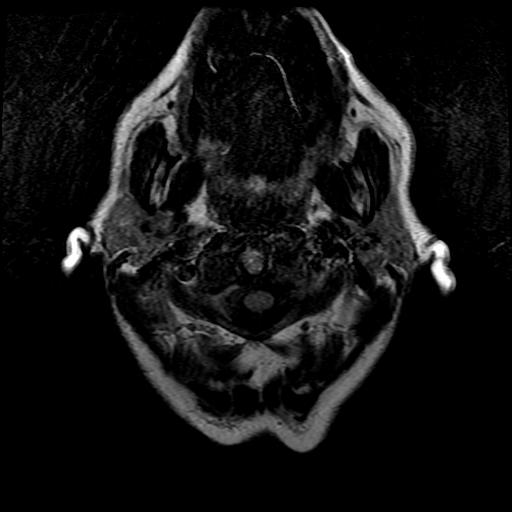
[im 13/26]
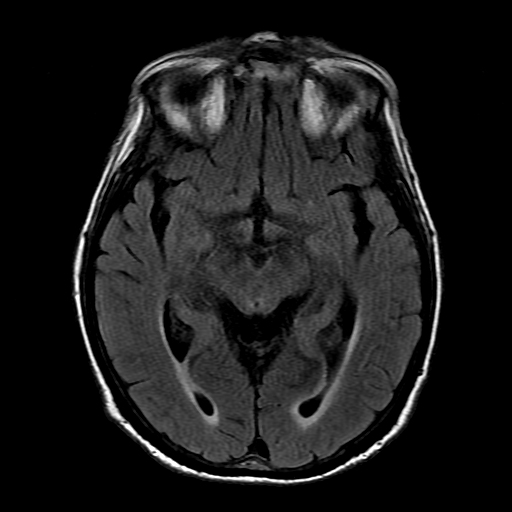
[im 26/26]
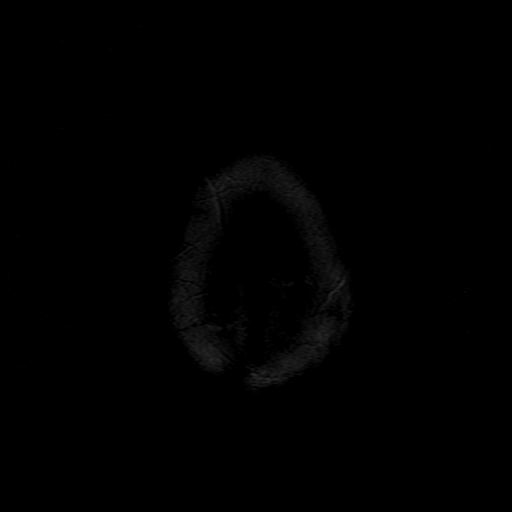

[Series 8: ax mpgr · axial · 5.0mm · 0.43mm/px · 1 of 26 slices shown]
[im 1/26]
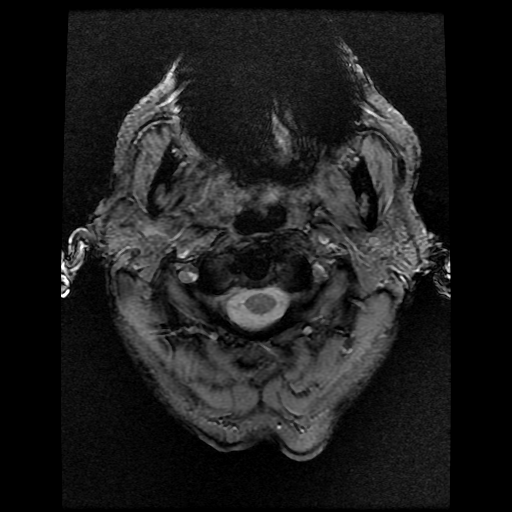

[Series 10: T2 · coronal · 5.0mm · 0.39mm/px · 3 of 28 slices shown (2 of 2)]
[im 1/28]
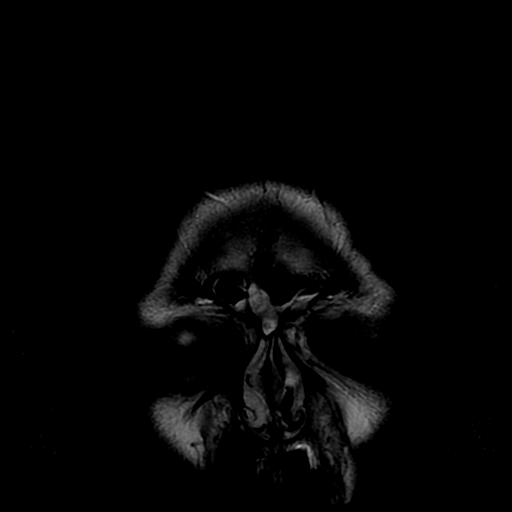
[im 14/28]
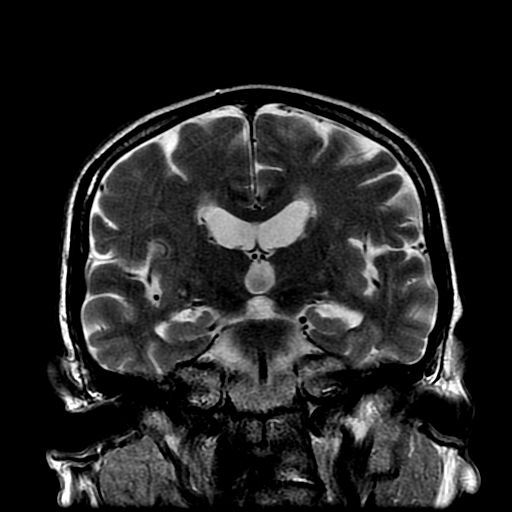
[im 28/28]
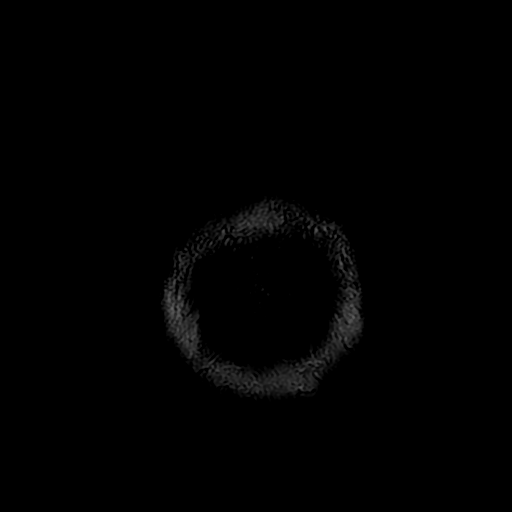

[Series 400: DWI · axial · 3.0mm · 1.09mm/px · z∈[-106,+40]mm · 5 of 50 slices shown (3 of 4)]
[im 1/50]
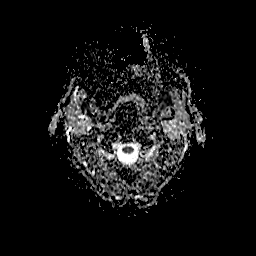
[im 13/50]
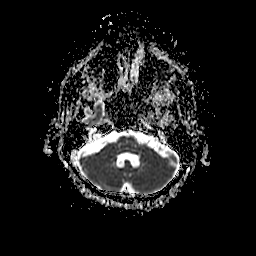
[im 25/50]
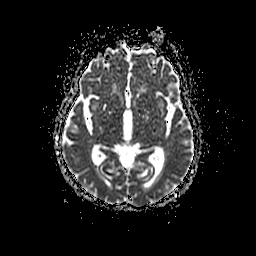
[im 37/50]
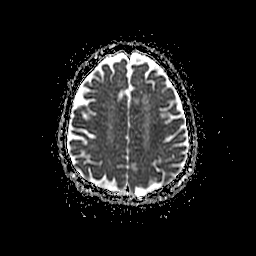
[im 50/50]
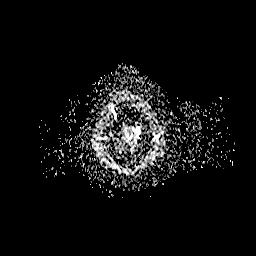

[Series 600: DWI · coronal · 5.0mm · 1.09mm/px · 4 of 36 slices shown (4 of 4)]
[im 1/36]
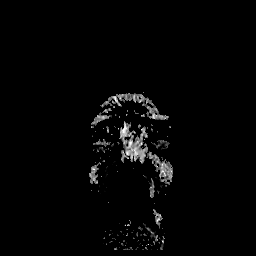
[im 12/36]
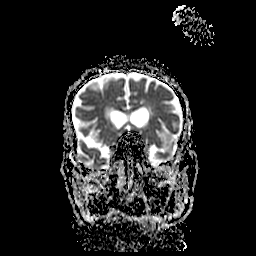
[im 24/36]
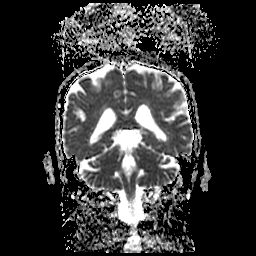
[im 36/36]
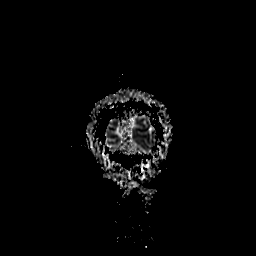

[36 of 48 positions shown; findings below may reference images not displayed]

FINDINGS: Mild diffuse prominence of the CSF containing spaces compatible with
generalized age-related cerebral atrophy. Patchy and confluent
T2/FLAIR hyperintensity within the periventricular and deep white
matter most consistent with chronic small vessel ischemic disease.
Small vessel type changes present within the central pons. Scattered
remote lacunar infarcts within the right thalamus and bilateral
basal ganglia.

No abnormal foci of restricted diffusion to suggest acute
intracranial infarct. Gray-white matter differentiation maintained.
Major intracranial vascular flow voids are preserved. No acute or
chronic intracranial hemorrhage.

No mass lesion, midline shift, or mass effect. Ventricular
prominence related to global parenchymal volume loss present without
hydrocephalus. No extra-axial fluid collection.

Craniocervical junction within normal limits. Pituitary gland
normal. No acute abnormality about the orbits. Sequela prior
bilateral lens extraction noted.

Scattered mucosal thickening throughout the ethmoidal air cells,
maxillary sinuses, and frontal sinuses. No air-fluid levels to
suggest active sinus infection. No significant mastoid effusion.

Inner ear structures grossly normal.

Bone marrow signal intensity within normal limits. No acute scalp
soft tissue abnormality.
IMPRESSION: 1. No acute intracranial process.
2. Mild generalized age-related cerebral atrophy with chronic small
vessel ischemic disease. Small remote lacunar infarcts within the
right thalamus and bilateral basal ganglia.

## 2017-08-12 ENCOUNTER — Other Ambulatory Visit: Payer: Self-pay | Admitting: Internal Medicine

## 2017-08-12 DIAGNOSIS — R609 Edema, unspecified: Secondary | ICD-10-CM

## 2017-08-13 ENCOUNTER — Other Ambulatory Visit: Payer: Medicare Other

## 2017-08-27 ENCOUNTER — Other Ambulatory Visit: Payer: Medicare Other

## 2017-08-30 ENCOUNTER — Other Ambulatory Visit: Payer: Self-pay | Admitting: Internal Medicine

## 2017-08-30 ENCOUNTER — Ambulatory Visit
Admission: RE | Admit: 2017-08-30 | Discharge: 2017-08-30 | Disposition: A | Discharge status: Home / Self Care | Payer: Medicare Other | Source: Ambulatory Visit | Attending: Internal Medicine | Admitting: Internal Medicine

## 2017-08-30 DIAGNOSIS — M545 Low back pain: Secondary | ICD-10-CM

## 2017-08-31 ENCOUNTER — Other Ambulatory Visit: Payer: Medicare Other

## 2017-09-06 ENCOUNTER — Ambulatory Visit: Payer: Medicare Other | Admitting: Allergy

## 2017-09-09 ENCOUNTER — Other Ambulatory Visit: Payer: Self-pay | Admitting: Internal Medicine

## 2017-09-09 DIAGNOSIS — M545 Low back pain, unspecified: Secondary | ICD-10-CM

## 2017-09-09 DIAGNOSIS — M546 Pain in thoracic spine: Secondary | ICD-10-CM

## 2017-09-14 ENCOUNTER — Other Ambulatory Visit: Payer: Medicare Other

## 2017-09-28 ENCOUNTER — Other Ambulatory Visit: Payer: Medicare Other

## 2017-10-11 ENCOUNTER — Ambulatory Visit
Admission: RE | Admit: 2017-10-11 | Discharge: 2017-10-11 | Disposition: A | Discharge status: Home / Self Care | Payer: Medicare Other | Source: Ambulatory Visit | Attending: Internal Medicine | Admitting: Internal Medicine

## 2017-10-11 DIAGNOSIS — R609 Edema, unspecified: Secondary | ICD-10-CM

## 2017-11-22 DEATH — deceased

## 2017-12-23 DEATH — deceased
